# Patient Record
Sex: Female | Born: 1940 | Race: White | Hispanic: No | Smoking: Never smoker
Health system: Southern US, Academic
[De-identification: ages and names within clinical notes are randomized; demographics above are authoritative.]

## PROBLEM LIST (undated history)

## (undated) DIAGNOSIS — M1990 Unspecified osteoarthritis, unspecified site: Secondary | ICD-10-CM

## (undated) DIAGNOSIS — C9140 Hairy cell leukemia not having achieved remission: Secondary | ICD-10-CM

## (undated) DIAGNOSIS — I10 Essential (primary) hypertension: Secondary | ICD-10-CM

## (undated) DIAGNOSIS — Z85828 Personal history of other malignant neoplasm of skin: Secondary | ICD-10-CM

## (undated) DIAGNOSIS — E039 Hypothyroidism, unspecified: Secondary | ICD-10-CM

## (undated) HISTORY — PX: ESOPHAGUS SURGERY: SHX626

## (undated) HISTORY — DX: Unspecified osteoarthritis, unspecified site: M19.90

## (undated) HISTORY — DX: Hairy cell leukemia not having achieved remission: C91.40

## (undated) HISTORY — PX: GALLBLADDER SURGERY: SHX652

## (undated) HISTORY — DX: Hypothyroidism, unspecified: E03.9

## (undated) HISTORY — PX: TONSILLECTOMY: SUR1361

## (undated) HISTORY — PX: KNEE SURGERY: SHX244

## (undated) HISTORY — PX: HX HERNIA REPAIR: SHX51

## (undated) HISTORY — DX: Personal history of other malignant neoplasm of skin: Z85.828

## (undated) HISTORY — DX: Essential (primary) hypertension: I10

---

## 1987-01-02 ENCOUNTER — Other Ambulatory Visit (HOSPITAL_COMMUNITY): Payer: Self-pay

## 2016-01-01 IMAGING — MG 2D SCREENING DIGITAL MAMMOGRAM WITH CAD
1 series · 5 of 5 positions shown · non-contrast
Comparison: Exam dated 01/05/14 and 06/10/15.

------------- REPORT [id number] -------------
MOLIMOTO, KUAN-YU

2D SCREENING DIGITAL MAMMOGRAM WITH CAD
JOSE ANAIN KUN,NP
Exam:  
2D digital screening mammogram with CAD
HISTORY: Asymptomatic 74-year-old with strong family history of breast cancer in mother and sister. Lifetime breast cancer risk 20%.

[Series 2863: R CC · right · 0.10mm/px · 5 of 5 slices shown]
[im 1/5]
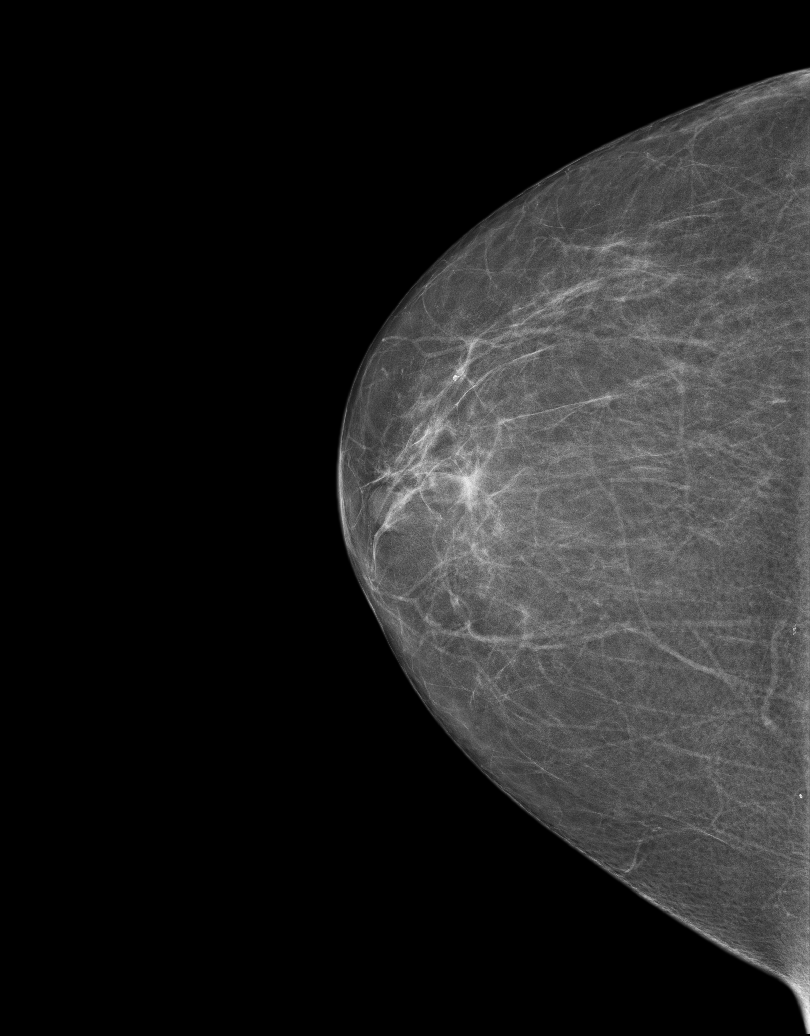
[im 2/5]
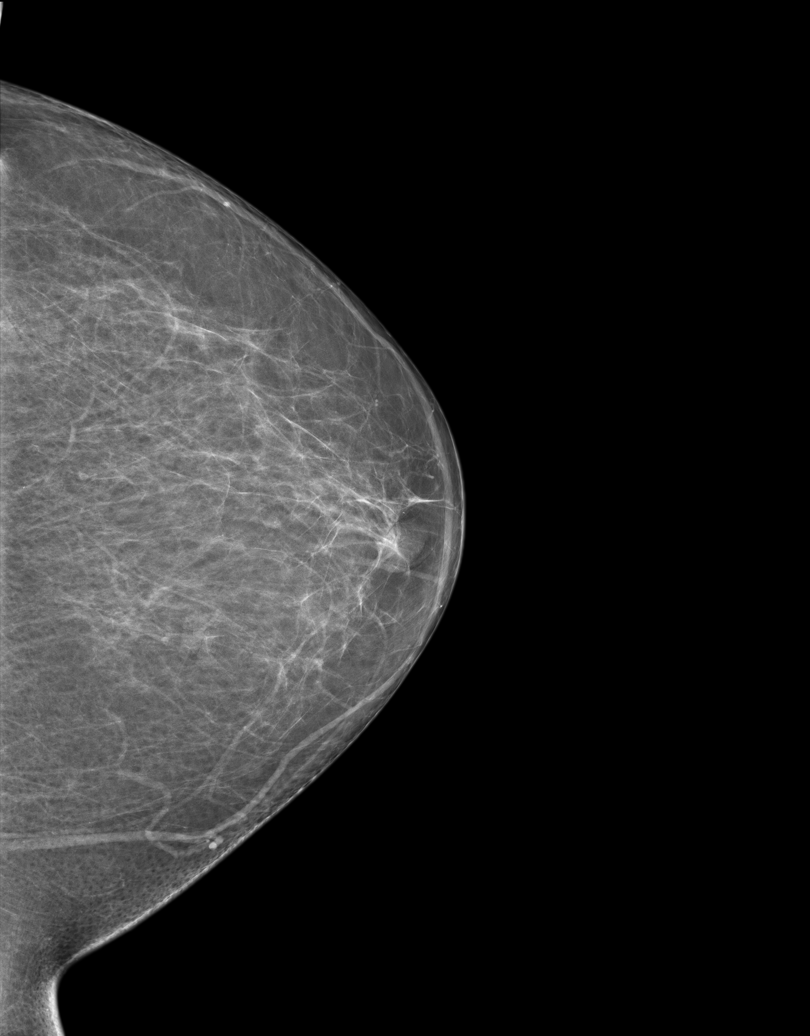
[im 3/5]
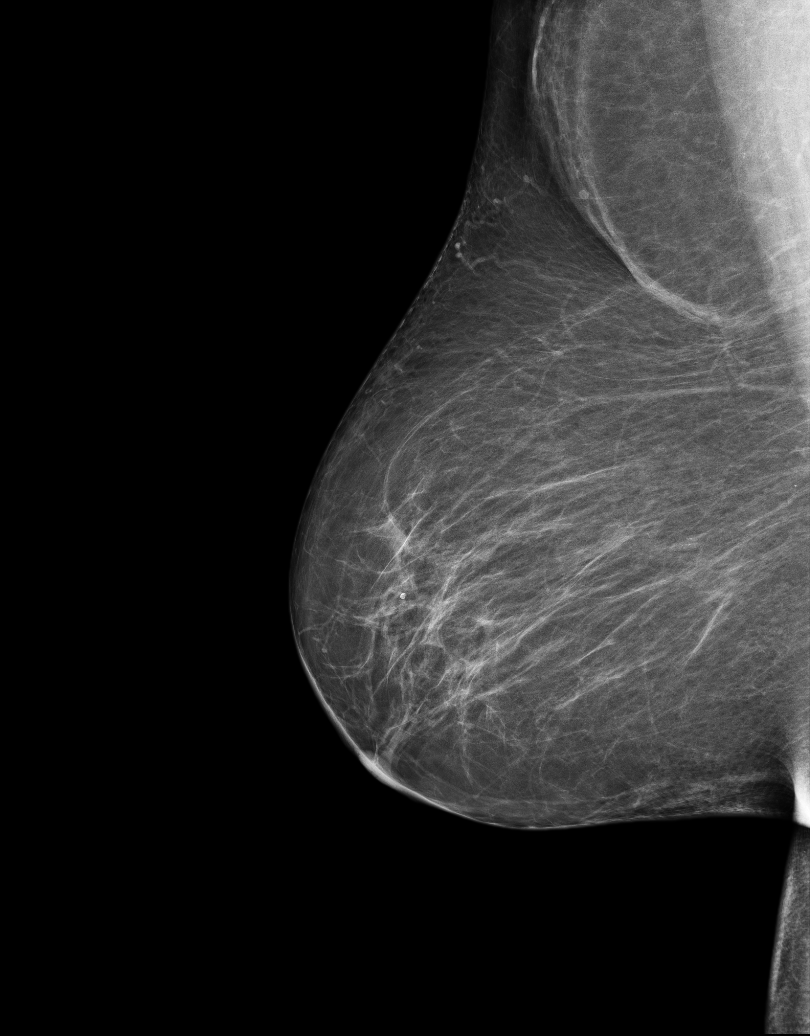
[im 4/5]
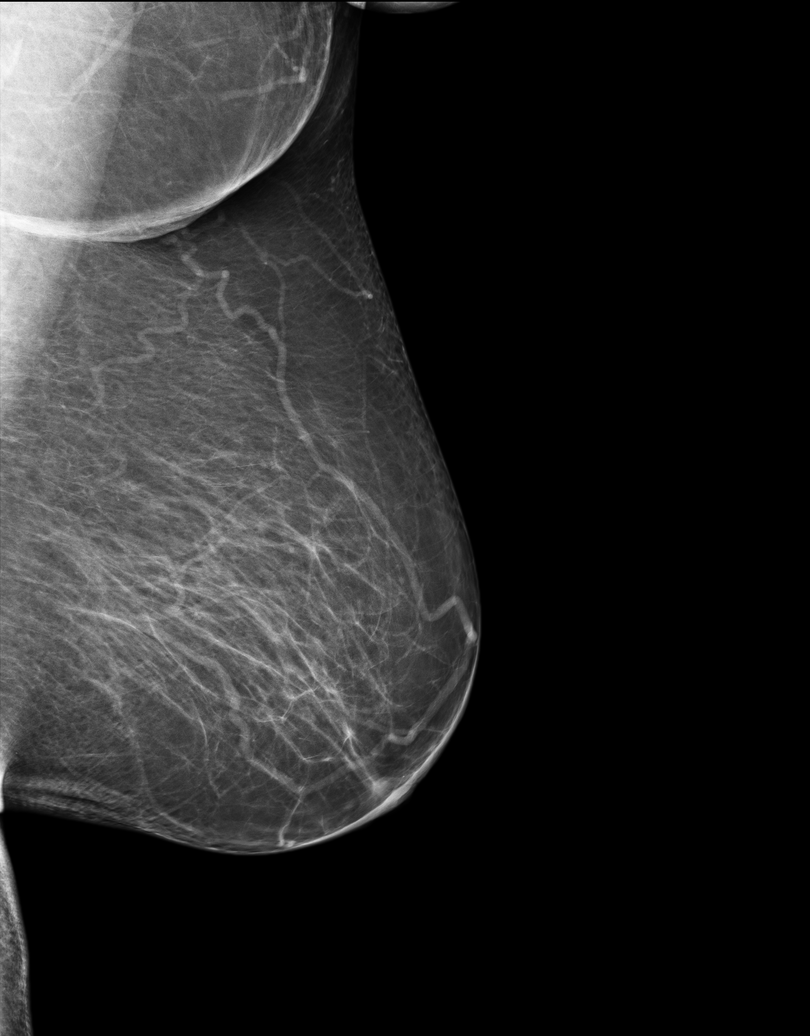
[im 5/5]
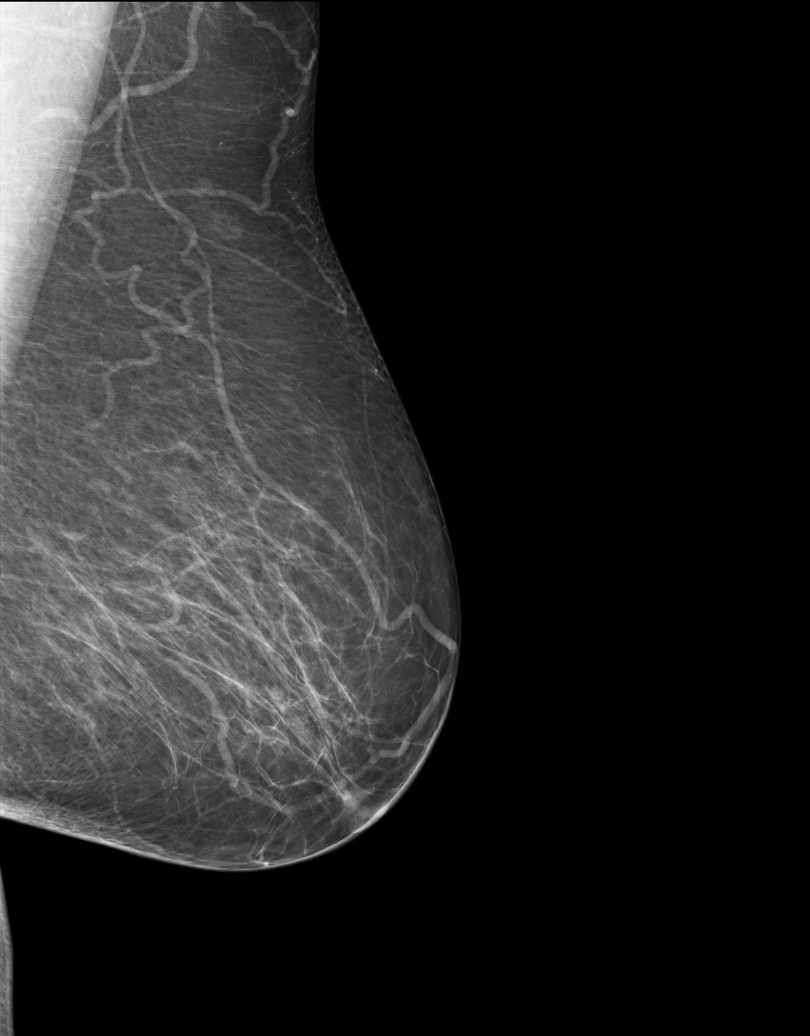

[5 of 5 positions shown; findings below may reference images not displayed]

FINDINGS: No focal mass or architectural change is noted. No abnormal calcific densities, skin changes, nipple changes, or duct dilation are seen. Benign calcific foci in the right breast are stable in appearance.
IMPRESSION: Stable mammographic findings. 

Clinical and mammographic followup at 12 months. 

Final Assessment Code:

Bi-Rads 2, density category B

BI-RADS 0
Need additional imaging evaluation

BI-RADS 1
Negative mammogram

BI-RADS 2
Benign finding

BI-RADS 3
Probably benign finding: short-interval follow-up suggested

BI-RADS 4
Suspicious abnormality:  biopsy should be considered

BI-RADS 5
Highly suggestive of malignancy; appropriate action should be taken

BI-RADS 6
Known Biopsy-proven Malignancy – Appropriate action should be taken

NOTE:
In compliance with Federal regulations, the results of this mammogram are being sent to the patient.

------------- REPORT [id number] -------------
[facility]
[address]
[place]ain[place] Ms.[place], [place]:
We wish to report the following on your recent mammography examination. We are sending a report to your referring physician or other health care provider. 
(       Normal/Negative:
No evidence of cancer.
This statement is mandated by the [place], Department of Health.
Your examination was performed by one of our technologists, who are registered radiological technologists and also specially certified in mammography:
___
Parlak, Edaly (M)
___
Dang, Mcalex (M)

Your mammogram was interpreted by our radiologist.

( 
Sofeine Made, M.D.

(Annual Breast Examination by a physician or other health care provider
(Annual Mammography Screening beginning at age 40
(Monthly Breast Self Examination

## 2018-12-11 IMAGING — MR MRI BRAIN WITHOUT AND WITH CONTRAST
8 of 11 series · 28 of 48 positions shown · IV contrast (gadavist)
Comparison: None.

EXAM:  MRI BRAIN WITHOUT AND WITH CONTRAST
INDICATION: Dizziness.
TECHNIQUE: Multiplanar, multisequential MRI of the brain and internal auditory canals was performed without and with 10 mL of Gadavist.

[Series 11: DWI · axial · 5.0mm · 1.35mm/px · z∈[-93,+63]mm · 3 of 27 slices shown (1 of 2)]
[im 1/27]
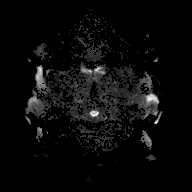
[im 14/27]
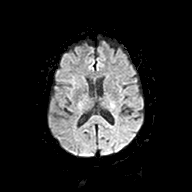
[im 27/27]
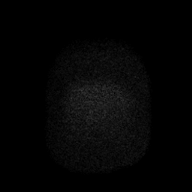

[Series 12: DWI · axial · 5.0mm · 1.35mm/px · z∈[-93,+63]mm · 3 of 27 slices shown (2 of 2)]
[im 1/27]
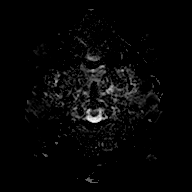
[im 14/27]
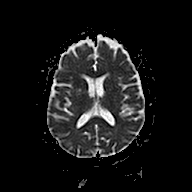
[im 27/27]
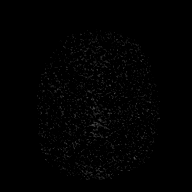

[Series 13: FLAIR · sagittal · 4.0mm · 0.75mm/px · 3 of 26 slices shown (1 of 2)]
[im 1/26]
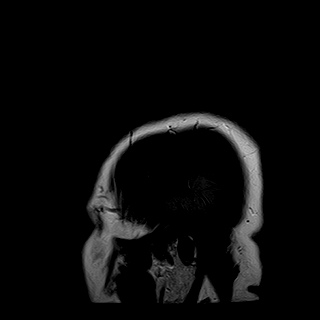
[im 13/26]
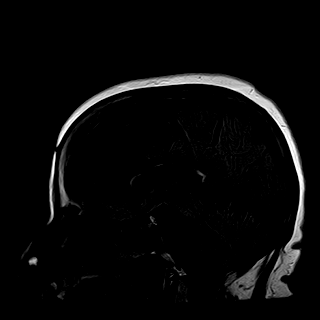
[im 26/26]
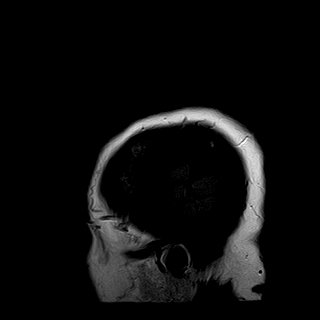

[Series 14: T2 · axial · 4.0mm · 0.43mm/px · z∈[-79,+61]mm · 6 of 36 slices shown]
[im 1/36]
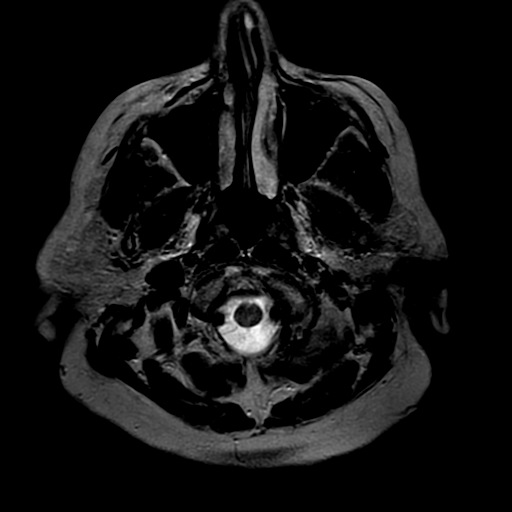
[im 8/36]
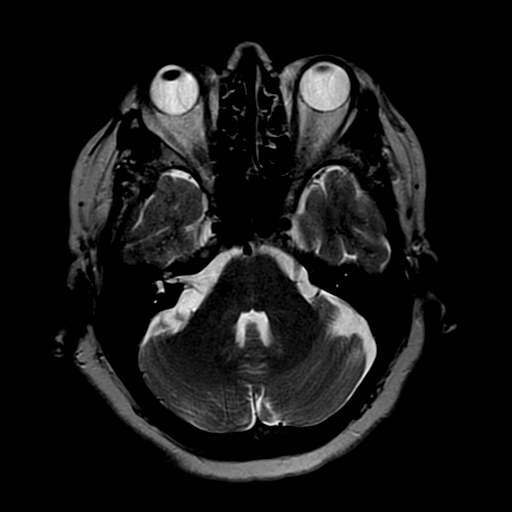
[im 15/36]
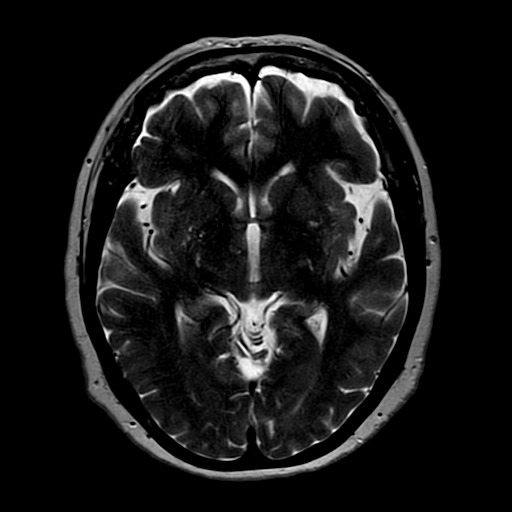
[im 22/36]
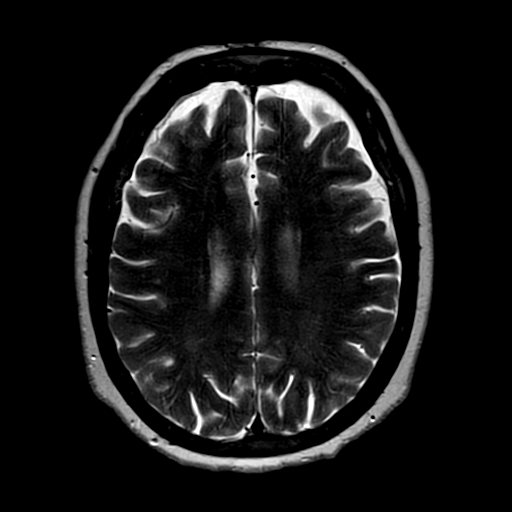
[im 29/36]
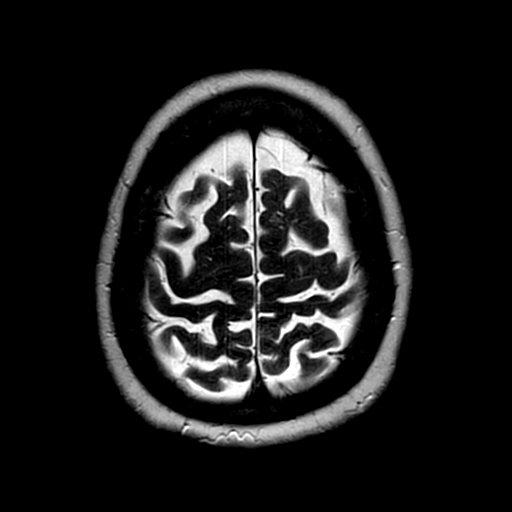
[im 36/36]
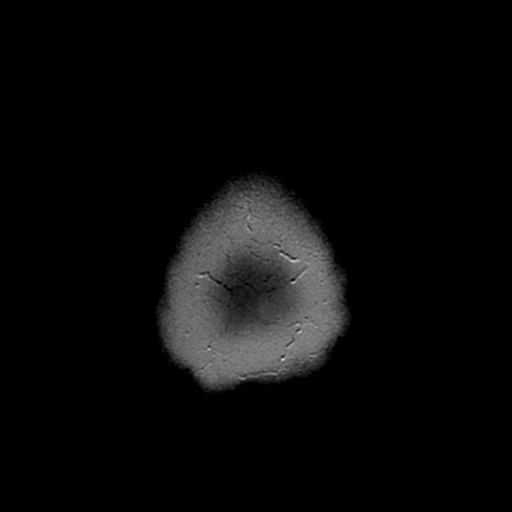

[Series 15: FLAIR · axial · 4.0mm · 0.43mm/px · z∈[-79,+61]mm · 6 of 36 slices shown (2 of 2)]
[im 1/36]
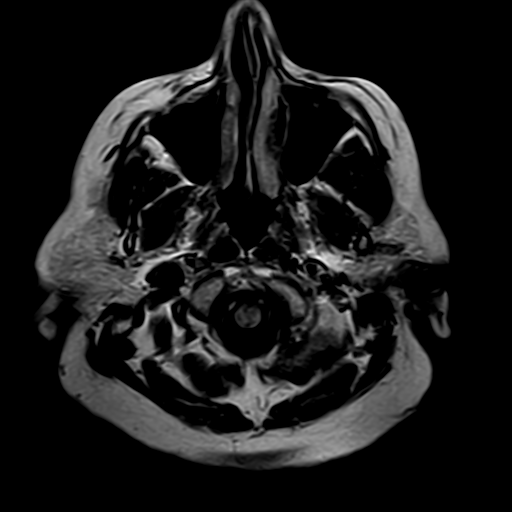
[im 8/36]
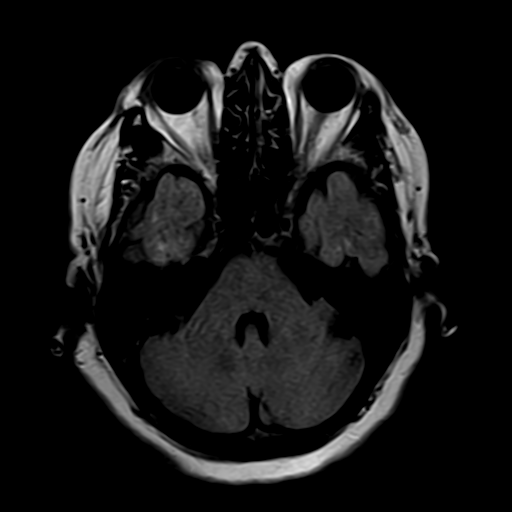
[im 15/36]
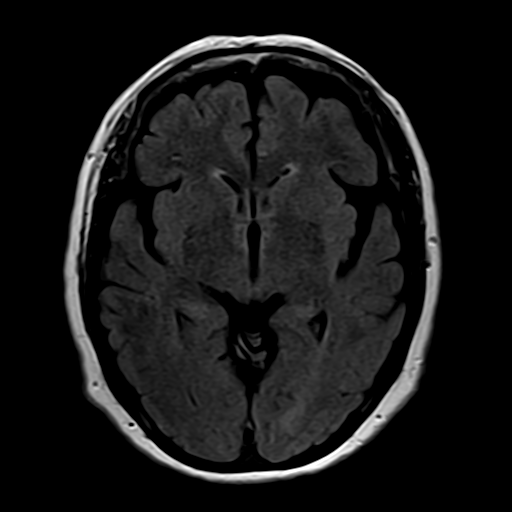
[im 22/36]
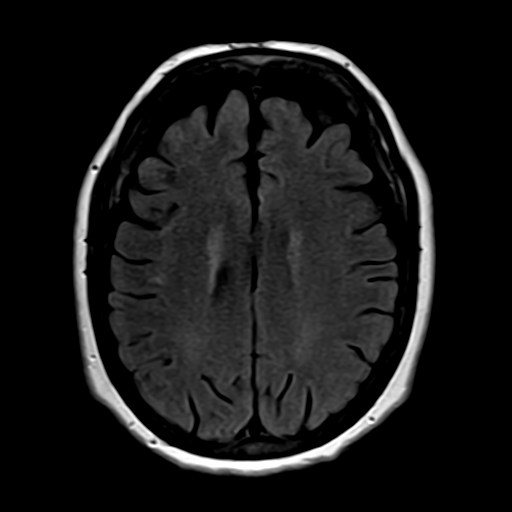
[im 29/36]
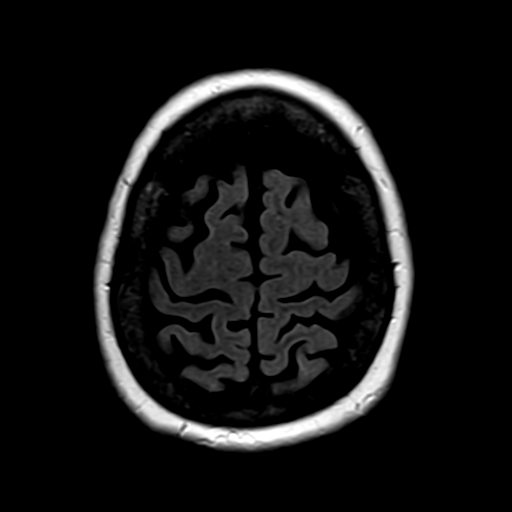
[im 36/36]
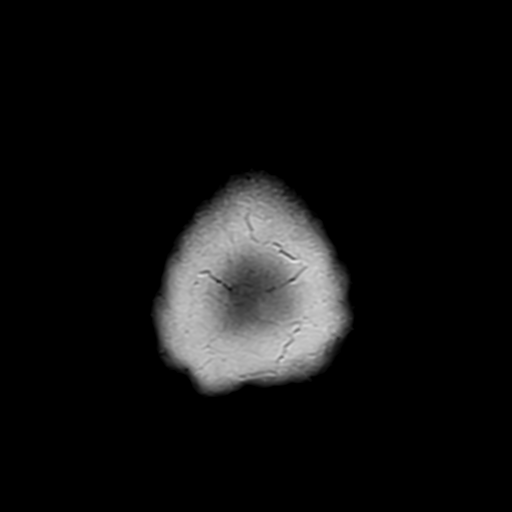

[Series 16: T1 · axial · 4.0mm · 0.43mm/px · z∈[-79,-23]mm · 3 of 36 slices shown]
[im 1/36]
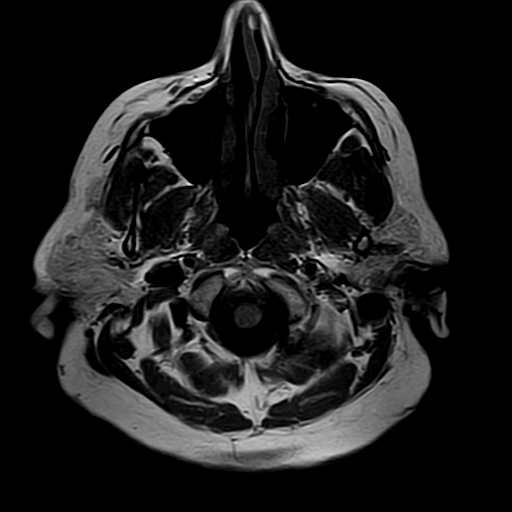
[im 8/36]
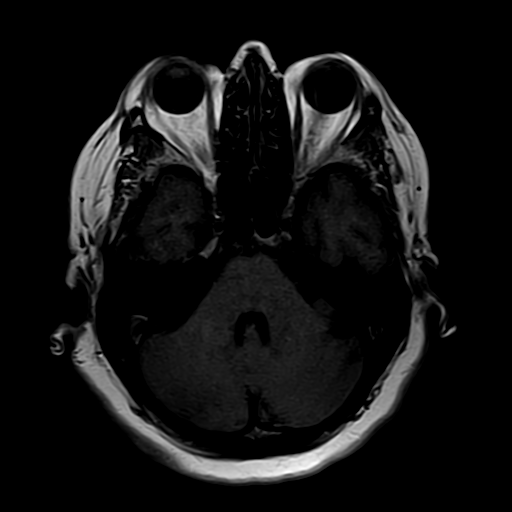
[im 15/36]
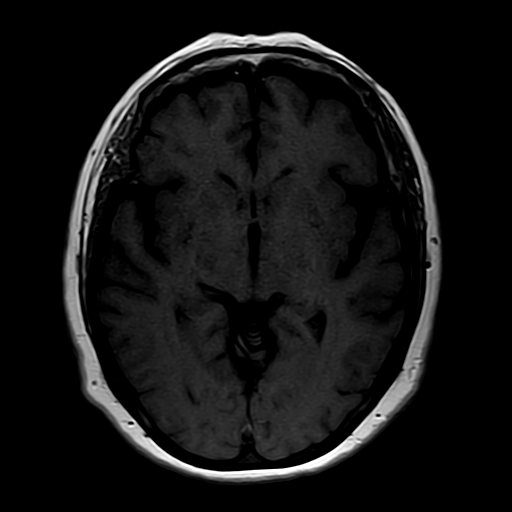

[Series 20: T1 post-contrast · axial · 3.0mm · 0.47mm/px · z∈[-67,-34]mm · 2 of 11 slices shown (1 of 2)]
[im 1/11]
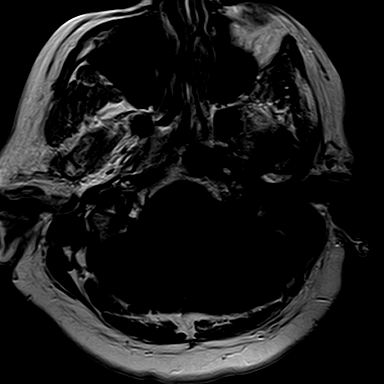
[im 11/11]
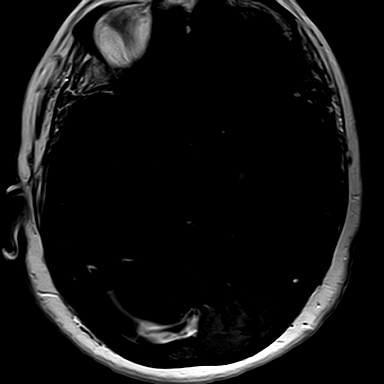

[Series 21: T1 post-contrast · coronal · 3.0mm · 0.47mm/px · 2 of 11 slices shown (2 of 2)]
[im 1/11]
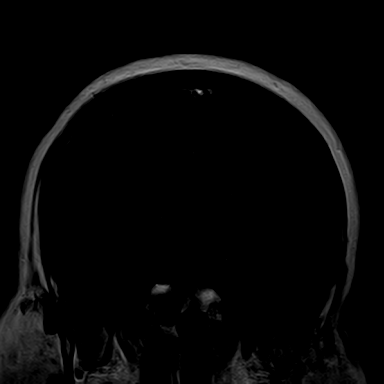
[im 11/11]
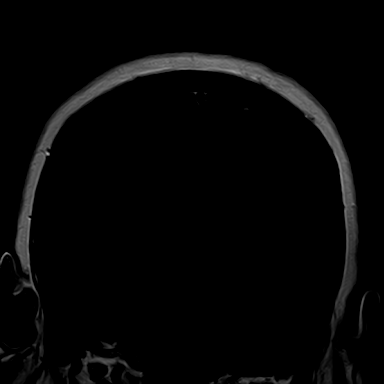

[28 of 48 positions shown; findings below may reference images not displayed]

FINDINGS: Ventricular and sulcal size is normal for the patient’s age.  There are minimal chronic small vessel ischemic changes.  There is no mass effect, midline shift or intracranial hemorrhage.  There is no evidence of acute infarction.  Skull base flow voids and basal cisterns are patent.  Sagittal survey of midline structures is unremarkable.  There is no cerebellopontine angle mass.  Normal T2 signal intensity is noted within the cochlea, vestibule and semicircular canals bilaterally. 

Following intravenous contrast administration, there is no abnormal parenchymal or leptomeningeal enhancement.  No abnormal enhancement of the internal auditory canals is seen.  There are no extraaxial fluid collections.  Visualized paranasal sinuses, mastoid air cells and orbital contents are also unremarkable.
IMPRESSION: Minimal chronic small vessel ischemic changes, no acute intracranial abnormality. 

Unremarkable internal auditory canals.

## 2019-01-12 IMAGING — MR MRI LUMBAR SPINE WITHOUT CONTRAST
9 series · 48 of 48 positions shown · IV contrast (gadolinium)
Comparison: None available.

EXAM:  MRI LUMBAR SPINE WITHOUT CONTRAST
INDICATION: Lower back pain and gait disturbance. History of back surgery 5 years ago.
TECHNIQUE: Multiplanar multisequential MRI of the lumbar spine was performed without gadolinium contrast.

[Series 4: sca (id) · sagittal · 10.0mm · 1.76mm/px · 1 of 5 slices shown (1 of 3)]
[im 1/5]
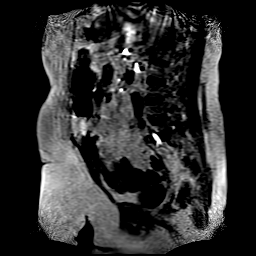

[Series 5: sca (id) · coronal · 10.0mm · 1.76mm/px · 2 of 5 slices shown (2 of 3)]
[im 1/5]
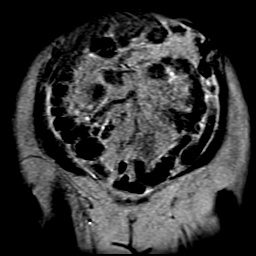
[im 5/5]
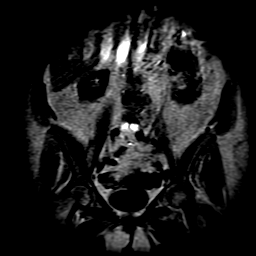

[Series 6: sca (id) · axial · 10.0mm · 1.76mm/px · z∈[-30,+30]mm · 2 of 5 slices shown (3 of 3)]
[im 1/5]
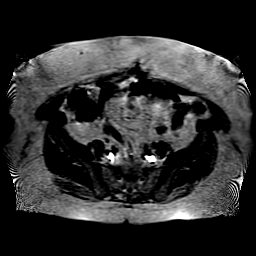
[im 5/5]
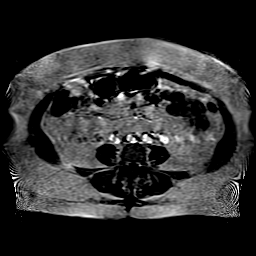

[Series 7: T2 · sagittal · 5.0mm · 1.08mm/px · 5 of 13 slices shown (1 of 3)]
[im 1/13]
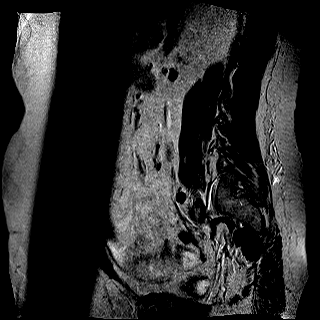
[im 4/13]
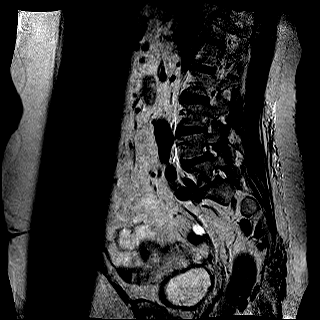
[im 7/13]
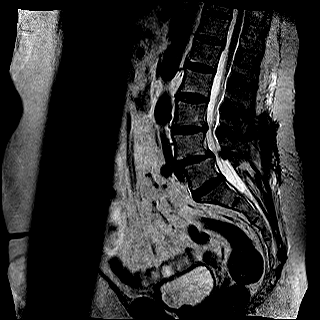
[im 10/13]
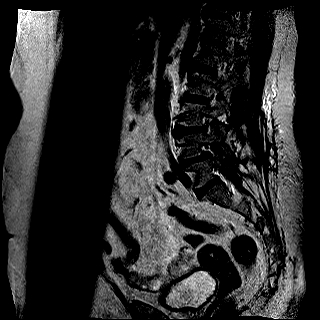
[im 13/13]
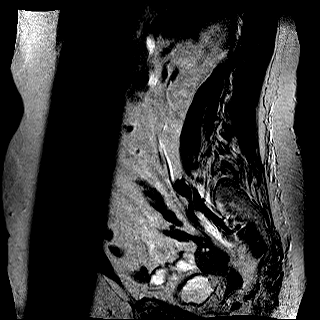

[Series 8: T1 · sagittal · 5.0mm · 1.08mm/px · 5 of 13 slices shown (1 of 2)]
[im 1/13]
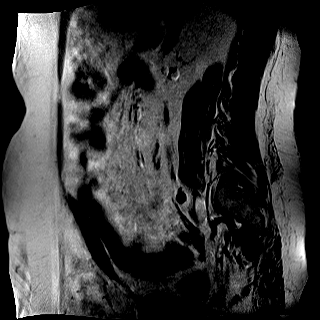
[im 4/13]
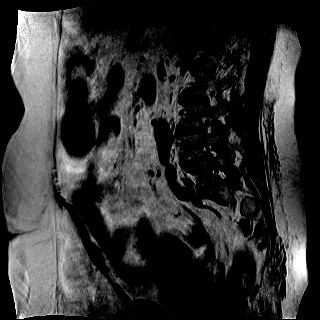
[im 7/13]
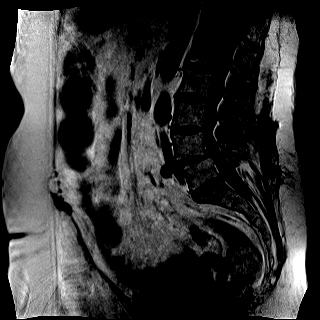
[im 10/13]
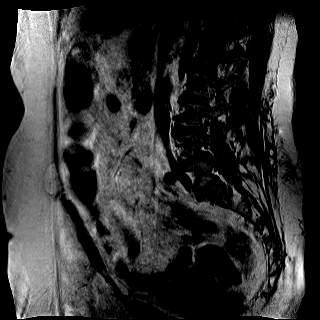
[im 13/13]
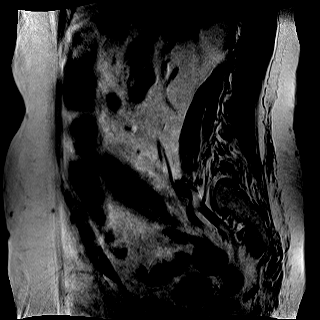

[Series 10: T2 · axial · 5.0mm · 0.89mm/px · z∈[-53,+146]mm · 10 of 25 slices shown (2 of 3)]
[im 1/25]
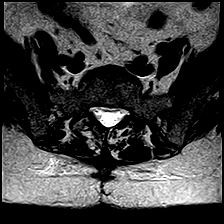
[im 3/25]
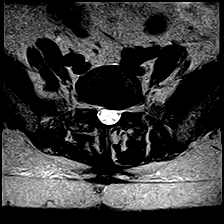
[im 6/25]
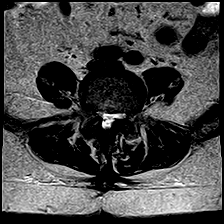
[im 9/25]
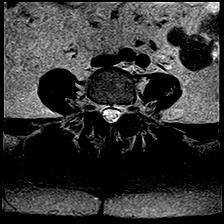
[im 11/25]
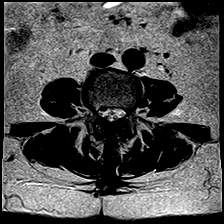
[im 14/25]
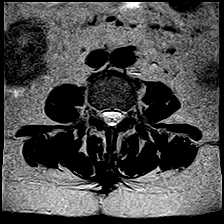
[im 17/25]
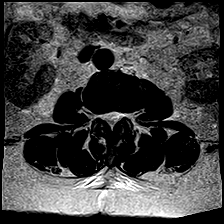
[im 19/25]
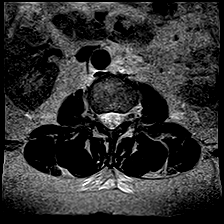
[im 22/25]
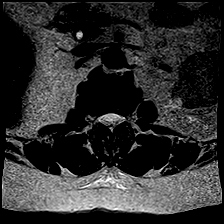
[im 25/25]
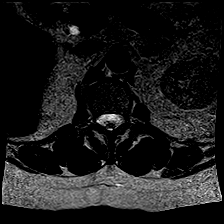

[Series 11: T1 · axial · 5.0mm · 0.89mm/px · z∈[-53,+146]mm · 10 of 25 slices shown (2 of 2)]
[im 1/25]
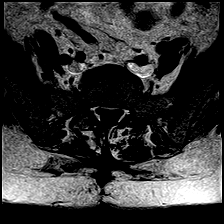
[im 3/25]
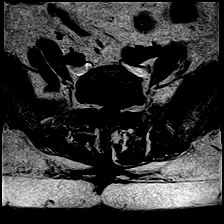
[im 6/25]
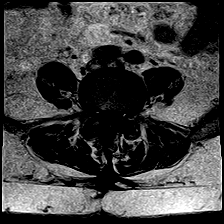
[im 9/25]
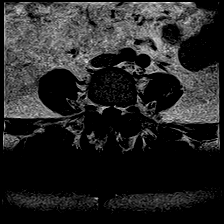
[im 11/25]
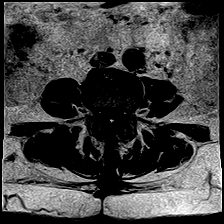
[im 14/25]
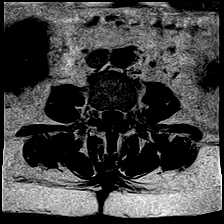
[im 17/25]
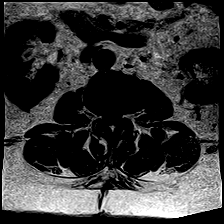
[im 19/25]
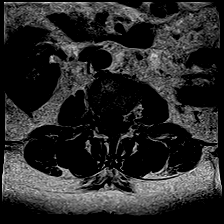
[im 22/25]
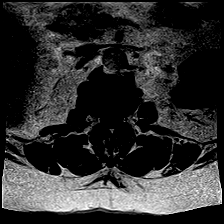
[im 25/25]
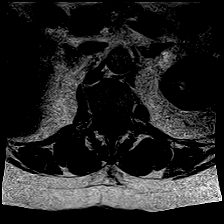

[Series 12: STIR · sagittal · 5.0mm · 1.35mm/px · 5 of 13 slices shown]
[im 1/13]
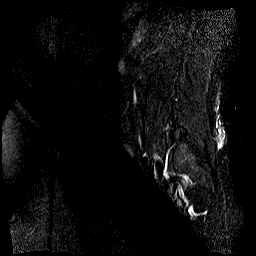
[im 4/13]
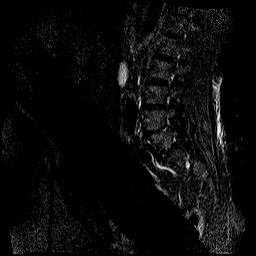
[im 7/13]
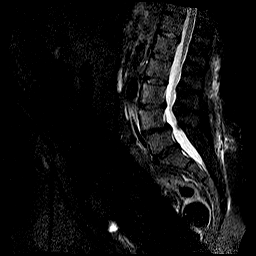
[im 10/13]
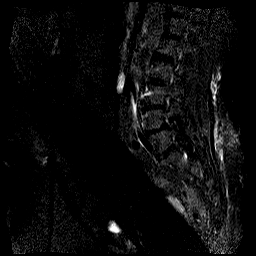
[im 13/13]
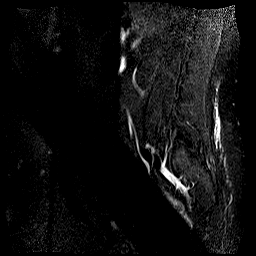

[Series 13: T2 · coronal · 4.0mm · 1.56mm/px · 8 of 20 slices shown (3 of 3)]
[im 1/20]
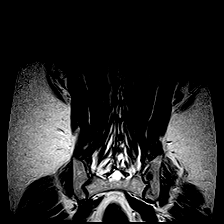
[im 3/20]
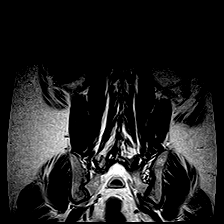
[im 6/20]
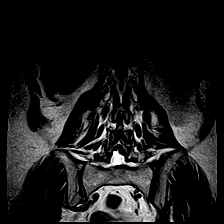
[im 9/20]
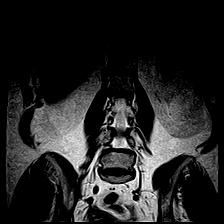
[im 11/20]
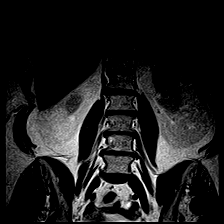
[im 14/20]
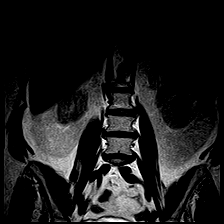
[im 17/20]
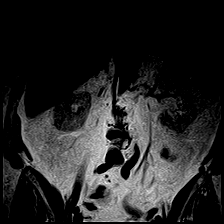
[im 20/20]
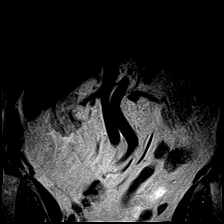

[48 of 48 positions shown; findings below may reference images not displayed]

FINDINGS: Vertebral bodies are normal in height and signal intensity. There is no acute fracture or subluxation. Distal spinal cord is normal in signal intensity and terminates normally at L1 vertebral body level. Spinal canal is congenitally narrow.

At T12-L1 level, there is a small broad-based central disc bulge mildly effacing the ventral thecal sac. There is no significant neural foraminal stenosis.

L1-2 level is unremarkable.

At L2-3 level, there is a minimal bulging annulus, minimally effacing the ventral thecal sac. There is no significant neural foraminal stenosis.

At L3-4 level, there is a small broad-based central disc bulge resulting in severe spinal stenosis. There is mild-to-moderate bilateral neural foraminal stenosis from facet arthropathy and bulging annulus.

At L4-5 level, there is grade 1 anterolisthesis of L4 on L5 vertebral body. There is also suggestion of a right hemilaminectomy defect at this level. There is a small broad-based central disc bulge resulting in moderate spinal stenosis. There is mild-to-moderate bilateral neural foraminal stenosis from facet arthropathy and bulging annulus.

At L5-S1 level, there is a small broad-based central and left paracentral disc bulge mildly effacing the ventral thecal sac. There is mild bilateral neural foraminal stenosis from facet arthropathy and bulging annulus without nerve root impingement.

Paraspinal soft tissues are unremarkable.
IMPRESSION: Suggestion of a right hemilaminectomy defect at L4-5 level. 

Grade 1 anterolisthesis of L4 on L5 vertebral body. 

Severe spinal stenosis at L3-4 level and moderate spinal stenosis at L4-5 level from small disc bulges. 

Multilevel neural foraminal stenosis as detailed above.

## 2019-01-12 IMAGING — MR MRI CERVICAL SPINE WITHOUT CONTRAST
4 of 5 series · 23 of 48 positions shown · IV contrast (gadolinium)
Comparison: None available.

EXAM:  MRI CERVICAL SPINE WITHOUT CONTRAST
INDICATION: Myelopathy.
TECHNIQUE: Multiplanar multisequential MRI of the cervical spine was performed without gadolinium contrast.

[Series 11: T2 · sagittal · 3.0mm · 0.75mm/px · 7 of 13 slices shown (1 of 2)]
[im 1/13]
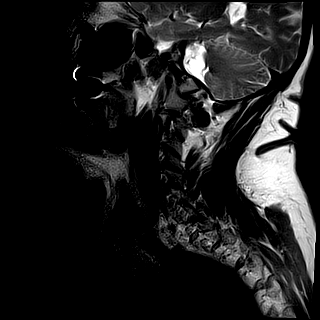
[im 3/13]
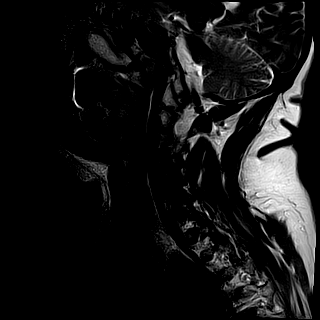
[im 5/13]
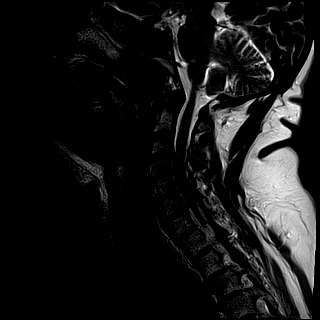
[im 7/13]
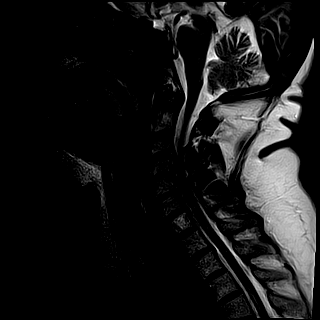
[im 9/13]
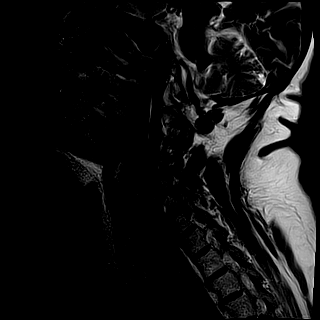
[im 11/13]
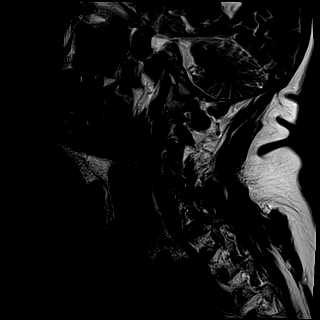
[im 13/13]
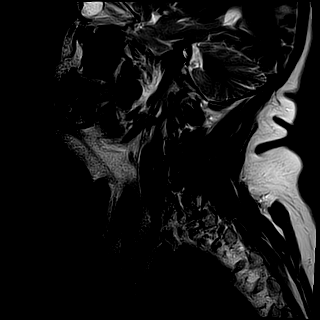

[Series 12: T1 · sagittal · 3.0mm · 0.47mm/px · 4 of 13 slices shown]
[im 1/13]
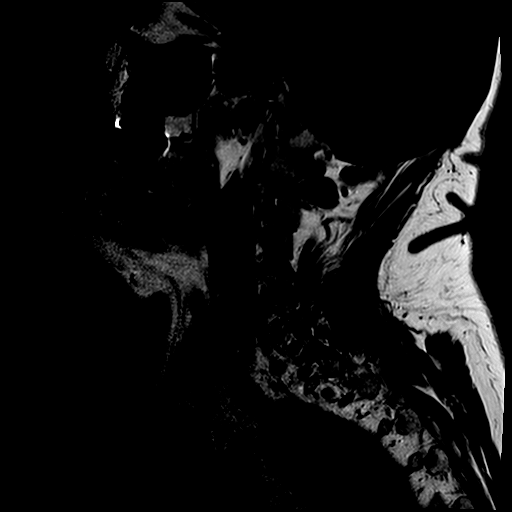
[im 2/13]
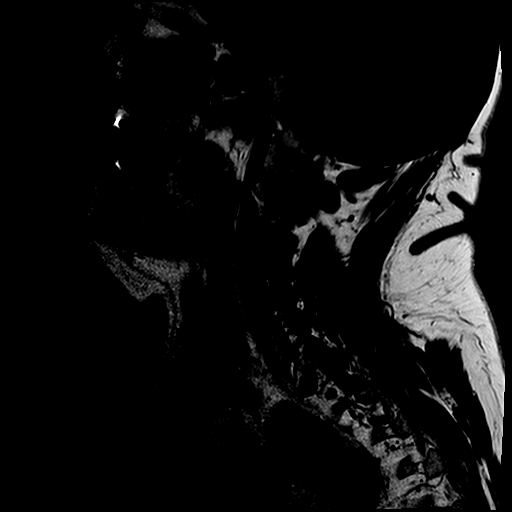
[im 7/13]
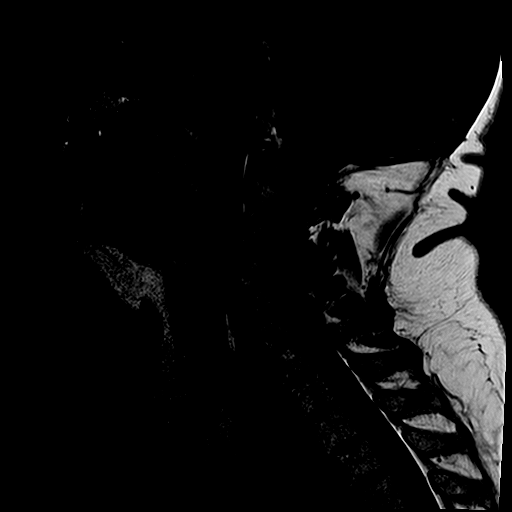
[im 11/13]
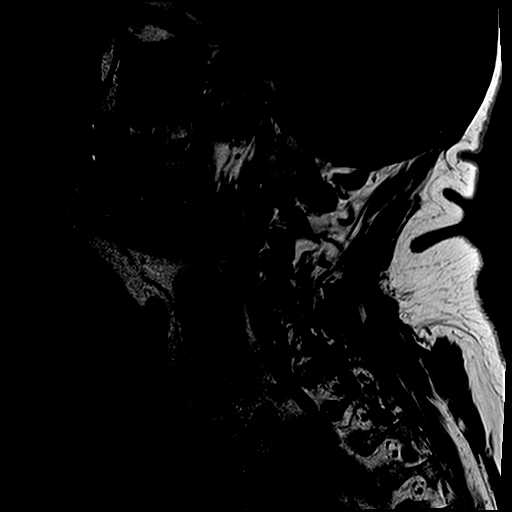

[Series 13: T2 · axial · 3.0mm · 0.35mm/px · z∈[-83,+17]mm · 9 of 26 slices shown (2 of 2)]
[im 1/26]
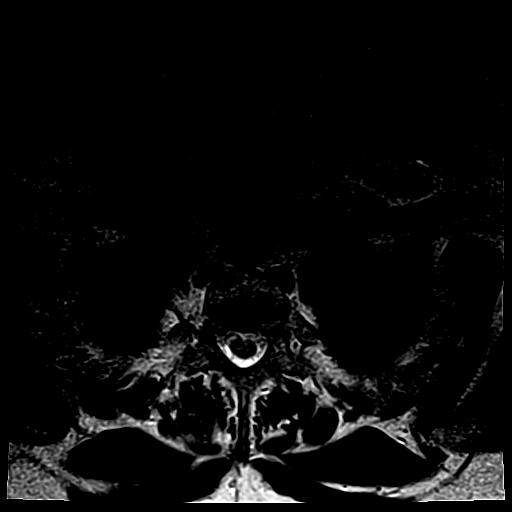
[im 4/26]
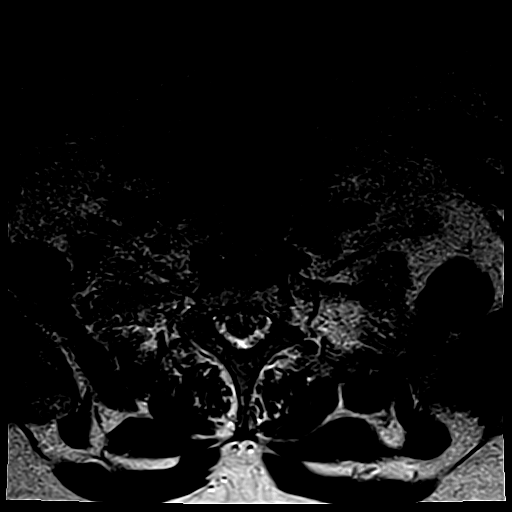
[im 8/26]
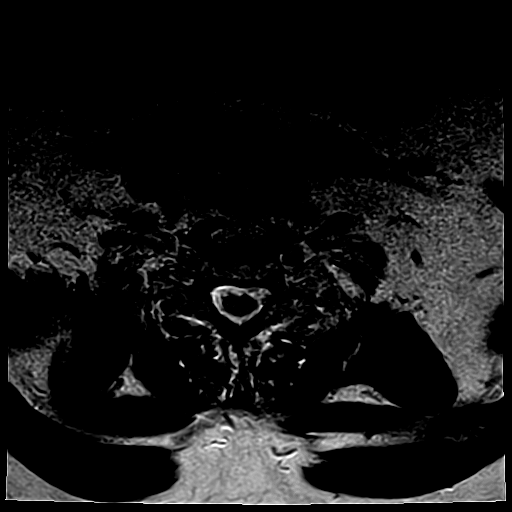
[im 11/26]
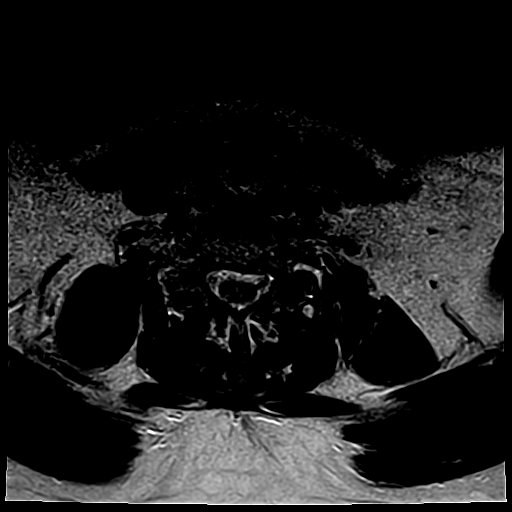
[im 13/26]
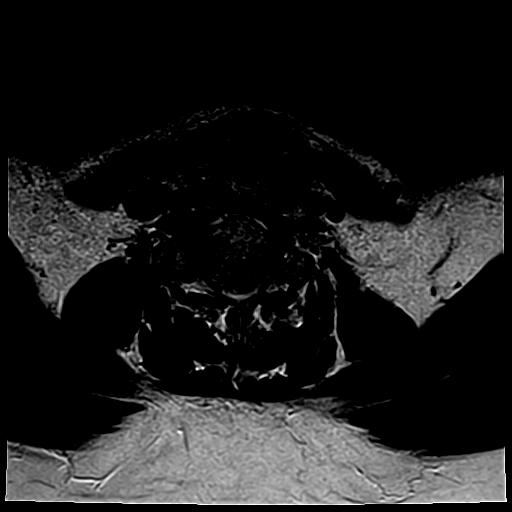
[im 15/26]
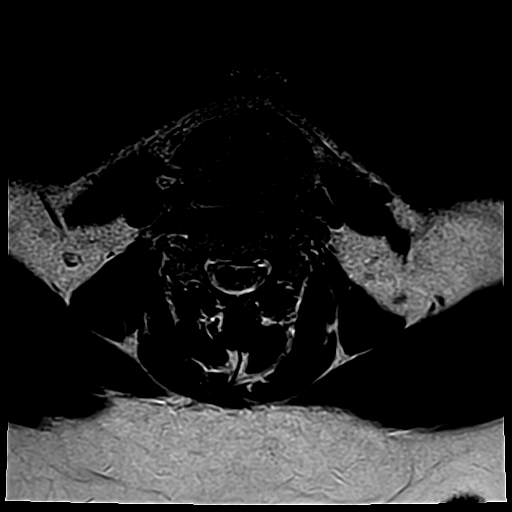
[im 18/26]
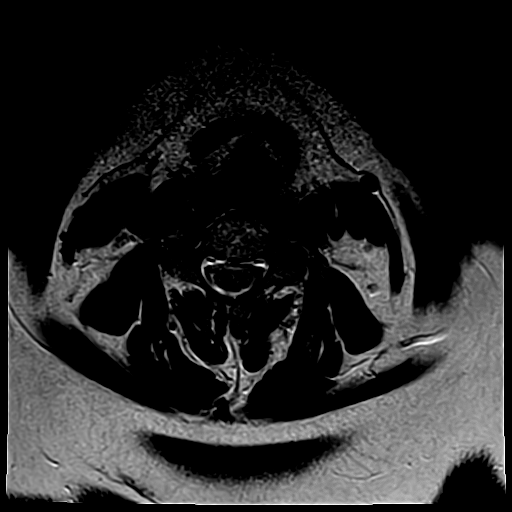
[im 22/26]
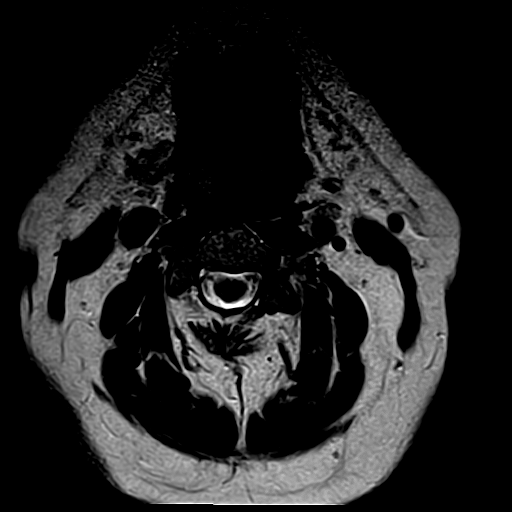
[im 26/26]
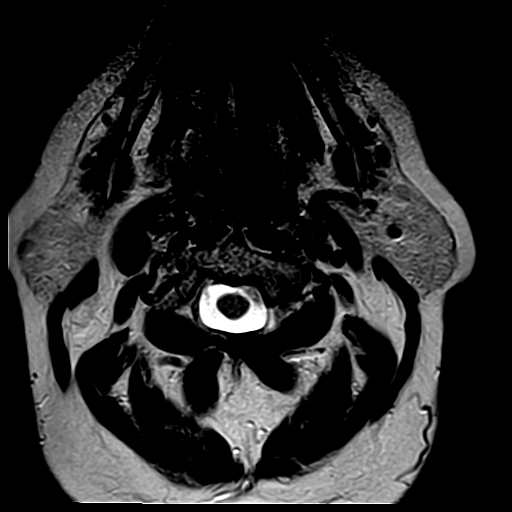

[Series 15: STIR · sagittal · 3.0mm · 0.47mm/px · 3 of 13 slices shown]
[im 2/13]
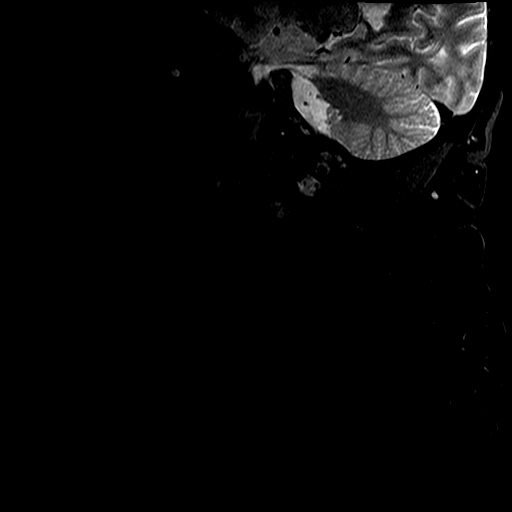
[im 7/13]
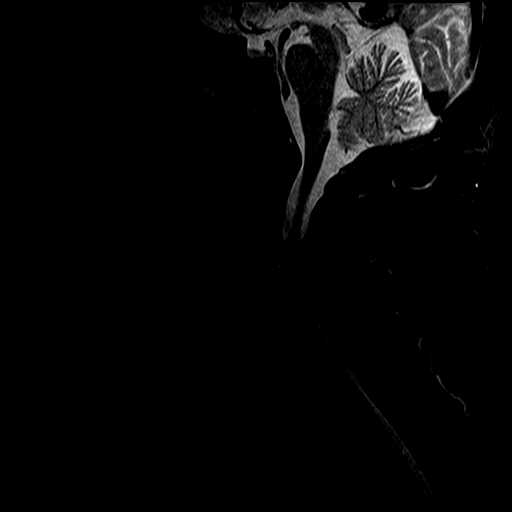
[im 11/13]
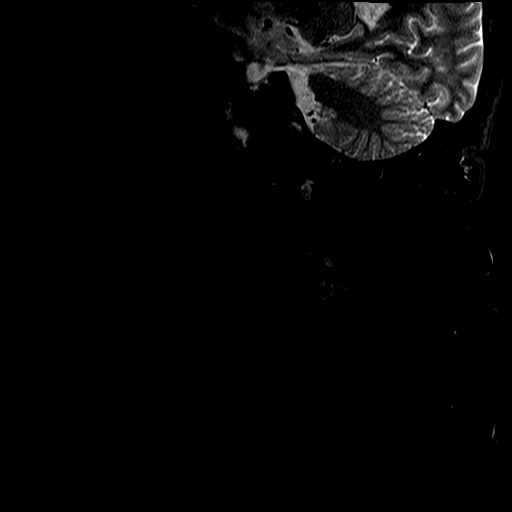

[23 of 48 positions shown; findings below may reference images not displayed]

FINDINGS: Vertebral bodies are normal in height, alignment and signal intensity. There is no acute fracture or subluxation. Visualized spinal cord is normal in signal intensity.

C2-3 level is unremarkable.

At C3-4 level, there is a small broad-based central disc osteophyte complex with near complete effacement of the ventral CSF. There is moderate right neural foraminal stenosis from uncovertebral joint hypertrophy.

At C4-5 level, there is a small broad-based central disc osteophyte complex with near complete effacement of the ventral CSF. There is severe right and moderate left neural foraminal stenosis from facet and uncovertebral joint hypertrophy.

At C5-6 level, there is a small broad-based central disc osteophyte complex resulting in mild-to-moderate spinal stenosis. There is severe bilateral neural foraminal stenosis from uncovertebral joint hypertrophy.

At C6-7 level, there is a small broad-based central disc osteophyte complex partially effacing the ventral CSF. There is severe bilateral neural foraminal stenosis from uncovertebral joint hypertrophy.

C7-T1 level and paraspinal soft tissues are unremarkable.
IMPRESSION: 1. Small disc osteophyte complexes at most levels with mild-to-moderate spinal stenosis at C5-6 level. 2. Multilevel neural foraminal stenosis as detailed above.

## 2021-03-27 ENCOUNTER — Encounter (INDEPENDENT_AMBULATORY_CARE_PROVIDER_SITE_OTHER): Payer: Self-pay

## 2021-04-01 ENCOUNTER — Encounter (INDEPENDENT_AMBULATORY_CARE_PROVIDER_SITE_OTHER): Payer: Self-pay | Admitting: Hematology & Oncology

## 2021-05-07 ENCOUNTER — Encounter (INDEPENDENT_AMBULATORY_CARE_PROVIDER_SITE_OTHER): Payer: Self-pay | Admitting: Family

## 2022-08-16 ENCOUNTER — Ambulatory Visit (INDEPENDENT_AMBULATORY_CARE_PROVIDER_SITE_OTHER): Payer: Self-pay | Admitting: NURSE PRACTITIONER

## 2023-02-04 ENCOUNTER — Emergency Department
Admission: EM | Admit: 2023-02-04 | Discharge: 2023-02-04 | Disposition: A | Discharge status: Home / Self Care | Payer: Medicare PPO | Attending: Emergency Medicine | Admitting: Emergency Medicine

## 2023-02-04 ENCOUNTER — Other Ambulatory Visit: Payer: Self-pay

## 2023-02-04 DIAGNOSIS — E669 Obesity, unspecified: Secondary | ICD-10-CM | POA: Insufficient documentation

## 2023-02-04 DIAGNOSIS — Z8571 Personal history of Hodgkin lymphoma: Secondary | ICD-10-CM | POA: Insufficient documentation

## 2023-02-04 DIAGNOSIS — I10 Essential (primary) hypertension: Secondary | ICD-10-CM | POA: Insufficient documentation

## 2023-02-04 DIAGNOSIS — R42 Dizziness and giddiness: Secondary | ICD-10-CM | POA: Insufficient documentation

## 2023-02-04 DIAGNOSIS — R7989 Other specified abnormal findings of blood chemistry: Secondary | ICD-10-CM

## 2023-02-04 DIAGNOSIS — Z6841 Body Mass Index (BMI) 40.0 and over, adult: Secondary | ICD-10-CM | POA: Insufficient documentation

## 2023-02-04 DIAGNOSIS — Z86718 Personal history of other venous thrombosis and embolism: Secondary | ICD-10-CM

## 2023-02-04 DIAGNOSIS — R072 Precordial pain: Secondary | ICD-10-CM | POA: Insufficient documentation

## 2023-02-04 LAB — COMPREHENSIVE METABOLIC PANEL, NON-FASTING
ALBUMIN/GLOBULIN RATIO: 1.3 (ref 0.8–1.4)
ALBUMIN: 4 g/dL (ref 3.5–5.7)
ALKALINE PHOSPHATASE: 95 U/L (ref 34–104)
ALT (SGPT): 17 U/L (ref 7–52)
ANION GAP: 4 mmol/L (ref 4–13)
AST (SGOT): 25 U/L (ref 13–39)
BILIRUBIN TOTAL: 0.3 mg/dL (ref 0.3–1.2)
BUN/CREA RATIO: 28 — ABNORMAL HIGH (ref 6–22)
BUN: 28 mg/dL — ABNORMAL HIGH (ref 7–25)
CALCIUM, CORRECTED: 9.3 mg/dL (ref 8.9–10.8)
CALCIUM: 9.3 mg/dL (ref 8.6–10.3)
CHLORIDE: 107 mmol/L (ref 98–107)
CO2 TOTAL: 27 mmol/L (ref 21–31)
CREATININE: 1.01 mg/dL (ref 0.60–1.30)
ESTIMATED GFR: 57 mL/min/{1.73_m2} — ABNORMAL LOW (ref >59)
GLOBULIN: 3 (ref 2.9–5.4)
GLUCOSE: 101 mg/dL (ref 74–109)
OSMOLALITY, CALCULATED: 281 mOsm/kg (ref 270–290)
POTASSIUM: 4.4 mmol/L (ref 3.5–5.1)
PROTEIN TOTAL: 7 g/dL (ref 6.4–8.9)
SODIUM: 138 mmol/L (ref 136–145)

## 2023-02-04 LAB — CBC WITH DIFF
BASOPHIL #: 0.1 10*3/uL (ref 0.00–0.10)
BASOPHIL %: 1 % (ref 0–1)
EOSINOPHIL #: 0.3 10*3/uL (ref 0.00–0.50)
EOSINOPHIL %: 3 %
HCT: 42.4 % — ABNORMAL HIGH (ref 31.2–41.9)
HGB: 14 g/dL (ref 10.9–14.3)
LYMPHOCYTE #: 1.9 10*3/uL (ref 1.00–3.00)
LYMPHOCYTE %: 22 % (ref 16–44)
MCH: 29.3 pg (ref 24.7–32.8)
MCHC: 33.1 g/dL (ref 32.3–35.6)
MCV: 88.5 fL (ref 75.5–95.3)
MONOCYTE #: 0.6 10*3/uL (ref 0.30–1.00)
MONOCYTE %: 7 % (ref 5–13)
MPV: 8.9 fL (ref 7.9–10.8)
NEUTROPHIL #: 5.7 10*3/uL (ref 1.85–7.80)
NEUTROPHIL %: 67 % (ref 43–77)
PLATELETS: 221 10*3/uL (ref 140–440)
RBC: 4.79 10*6/uL (ref 3.63–4.92)
RDW: 13.7 % (ref 12.3–17.7)
WBC: 8.5 10*3/uL (ref 3.8–11.8)

## 2023-02-04 LAB — TROPONIN-I
TROPONIN I: 7 ng/L (ref <15)
TROPONIN I: 7 ng/L (ref <15)

## 2023-02-04 LAB — GOLD TOP TUBE

## 2023-02-04 LAB — MAGNESIUM: MAGNESIUM: 1.8 mg/dL — ABNORMAL LOW (ref 1.9–2.7)

## 2023-02-04 LAB — BLUE TOP TUBE

## 2023-02-04 MED ORDER — MAGNESIUM SULFATE 1 GRAM/100 ML IN DEXTROSE 5 % INTRAVENOUS PIGGYBACK
INJECTION | INTRAVENOUS | Status: AC
Start: 2023-02-04 — End: 2023-02-04
  Filled 2023-02-04: qty 200

## 2023-02-04 MED ORDER — MAGNESIUM SULFATE 1 GRAM/100 ML IN DEXTROSE 5 % INTRAVENOUS PIGGYBACK
1.0000 g | INJECTION | INTRAVENOUS | Status: AC
Start: 2023-02-04 — End: 2023-02-04
  Administered 2023-02-04 (×2): 1 g via INTRAVENOUS
  Administered 2023-02-04 (×2): 0 g via INTRAVENOUS

## 2023-02-04 MED ORDER — SODIUM CHLORIDE 0.9 % IV BOLUS
500.0000 mL | INJECTION | Status: AC
Start: 2023-02-04 — End: 2023-02-04
  Administered 2023-02-04: 500 mL via INTRAVENOUS
  Administered 2023-02-04: 0 mL via INTRAVENOUS

## 2023-02-04 NOTE — Discharge Instructions (Signed)
Make sure that you are drinking plenty of fluids throughout the day   See a cardiologist from the list provided below within the next 1 to 2 weeks.  You do need to have a formal workup for chest pain.  Return to the ER for any emergencies   Continue all your normal medications as prescribed by your doctor   Use progressive standing when standing up to prevent dizziness and falls.  Cardiologist List    Cindie Crumbly,  MD  [facility]  [address]  [place], [place] [address]  [phone]    Naeem A. Lana Fish, MD, [facility]  [address] [facility]  [place], [place] [address]  [phone]    Lonzo Cloud, MD, [facility]  [address] [place], [place] [address]  [phone]    Dorcas Mcmurray, MD  [address]  [place], [place] [address]  [phone]    Maryella Shivers, MD  [address]  [place], [place] [address]  [phone]    Elam Dutch, MD  [address]  [place], [place] [address]  [phone]

## 2023-02-04 NOTE — ED Triage Notes (Signed)
BP was 170/112 1 hour ago at home and it has been doing this for the past 3 days. Reports feeling off balance when going from sitting to standing and nauseous.

## 2023-02-04 NOTE — ED Nurses Note (Signed)
Patient discharged home with family. AVS reviewed with patient. A written copy of the AVS and discharge instructions were given to the patient. Questions sufficiently answered as needed. IV removed, intact, pressure dressing applied, no bleeding noted. Patient encouraged to follow up with PCP as indicated. In the event of an emergency, patient instructed to call 911 or go to the nearest emergency room. Patient left department via ambulation.

## 2023-02-04 NOTE — ED Provider Notes (Signed)
[facility]  ED Primary Provider Note  Patient Name: Adrienne Barton  Patient Age: 82 y.o.  Date of Birth: [birth year]    Chief Complaint: Dizziness        History of Present Illness       Adrienne Barton is a 82 y.o. female who had concerns including Dizziness.     HPI      chief complaint:  Dizziness    HPI  Location generalized   Onset acute  Timing:  2 to 3 days.  However her blood pressure has been elevated 3 days.  Duration intermittent  Modifying factors:  Patient states it is really only when she stands up from a seated position that it occurs.  Additional symptoms:  Patient has anterior neck pain.  She states she also has chest pain in the anterior portion of the chest midline.  She states this is been intermittent for the past 3 days.  She states it is achy in quality and rated 2/10.  She states that this pain is constant today but intermittent prior to this.  Additionally, the patient has had nausea no vomiting she has had diaphoresis.  She has no syncope or presyncopal episodes but she states that she does have dizziness when standing up.  Patient also states her blood pressure has been up for the past 3 days.  Here it was 170/112.  She states it is normally around 135/80.  Mechanism:  Patient does have risk factors for pulmonary embolism.  She also has risk factors for coronary artery disease.  Deep vein thrombosis/PE: Patient has a history of hairy cell leukemia.  She has never smoked.  She is 82 years old.  The patient has no immobility.  No recent surgeries or orthopedic injuries.  She has no personal history of deep vein thrombosis/PE.  Risk factors for coronary artery disease: Hypertension, age, obesity    PMH: reviewed and listed on the chart below.   SH: reviewed and listed on the chart below.   Social History: reviewed and listed on the chart below.   Family History: reviewed and listed on the chart below.       PMHx:    Past Medical History:   Diagnosis Date    DJD  (degenerative joint disease)     Hairy cell leukemia (CMS HCC)     History of basal cell cancer     Hypertension     Hypothyroidism     Osteoarthritis     PSHx:   Past Surgical History:   Procedure Laterality Date    ESOPHAGUS SURGERY      GALLBLADDER SURGERY      HX HERNIA REPAIR      KNEE SURGERY Right     TONSILLECTOMY         Allergies:    Allergies   Allergen Reactions    Adhesive Rash     Both paper and adhesive tape cause blisters    Social History  Social History     Tobacco Use    Smoking status: Never    Smokeless tobacco: Never   Vaping Use    Vaping status: Never Used   Substance Use Topics    Alcohol use: Not Currently    Drug use: Never       Family History  Family Medical History:       Problem Relation (Age of Onset)    Breast Cancer Mother    Cancer Father, Sister  Colon Cancer Sister    Kidney Cancer Brother    Polycythemia Sister               Home Meds:      Prior to Admission medications    Medication Sig Start Date End Date Taking? Authorizing Provider   aspirin (ECOTRIN) 81 mg Oral Tablet, Delayed Release (E.C.) Take 1 Tablet (81 mg total) by mouth Once a day 06/13/14  Yes Provider, Historical   buPROPion (WELLBUTRIN SR) 150 mg Oral tablet sustained-release 12 hr Take 1 Tablet (150 mg total) by mouth Twice daily   Yes Provider, Historical   busPIRone (BUSPAR) 5 mg Oral Tablet Take 1 Tablet (5 mg total) by mouth Twice per day as needed for Other   Yes Provider, Historical   celecoxib (CELEBREX) 100 mg Oral Capsule Take 1 Capsule (100 mg total) by mouth Twice daily 06/13/14  Yes Provider, Historical   DOCOSAHEXAENOIC ACID ORAL Take 1 g by mouth Twice daily 06/13/14  Yes Provider, Historical   DULoxetine (CYMBALTA DR) 60 mg Oral Capsule, Delayed Release(E.C.) Take 1 Capsule (60 mg total) by mouth Once a day   Yes Provider, Historical   levothyroxine (SYNTHROID) 200 mcg Oral Tablet Take 1 Tablet (200 mcg total) by mouth Every morning   Yes Provider, Historical   levothyroxine (SYNTHROID) 75 mcg  Oral Tablet Take 1 Tablet (75 mcg total) by mouth Every morning   Yes Provider, Historical   losartan (COZAAR) 50 mg Oral Tablet Take 1 Tablet (50 mg total) by mouth Once a day 06/13/14  Yes Provider, Historical          ROS: A total of 10 systems were reviewed by me at the time of the visit. All are negative except the HPI and the following.           Physical Exam   ED Triage Vitals [02/04/23 1512]   BP (Non-Invasive) (!) 168/93   Heart Rate 91   Respiratory Rate 18   Temperature 36.6 C (97.9 F)   SpO2 97 %   Weight 134 kg (295 lb)   Height 1.6 m (5\' 3" )     Orthostatic vital signs   Supine 159/113  85    Sitting 183/144  94    Standing 179/118 105    Physical exam  Constitutional/general: The nursing notes were reviewed and agreed upon.  The vital signs were reviewed and are listed on the chart.   82 year old female who appears to be younger than her stated age.  She is currently  in no acute distress.  Patient appears to be comfortable and in no pain.  HEENT: Eyes show normal extraocular movements.  Well-hydrated oral mucosa is noted.  There is no facial trauma or abnormalities.  Neck: No midline tenderness, no meningeal signs.  Full range of motion.  No JVD.    Cardiovascular: Heart is regular rate and rhythm S1-S2 sounds were auscultated without murmur click or rub  Respiratory: Lungs are clear to auscultation in all fields without wheeze rale or rhonchi.  GI: Abdomen is soft non tender normal bowel sounds are auscultated.  There is no rebound tenderness or guarding.  Obese abdomen with a BMI of 53.  Neuro cranial nerves II through XII are intact and normal.  Patient has normal speech and normal gait.  There is no muscle weakness in any extremities.  Sensation is intact throughout.   Psych: Patient is alert and oriented person place and time.  Patient is very pleasant  converse with has a euthymic affect.  There are no signs of depression or anxiety.  Skin: No rash.  No petechiae or purpura.  The skin is warm  and dry without diaphoresis.  There is no pallor.  Musculoskeletal: There is no tenderness to palpation in any body region.  There is no lower extremity edema and no calf tenderness.  Negative Homan's sign bilaterally.  GU: Deferred        Procedures      Patient Data     Labs Ordered/Reviewed   COMPREHENSIVE METABOLIC PANEL, NON-FASTING - Abnormal; Notable for the following components:       Result Value    BUN 28 (*)     BUN/CREA RATIO 28 (*)     ESTIMATED GFR 57 (*)     All other components within normal limits    Narrative:     Estimated Glomerular Filtration Rate (eGFR) is calculated using the CKD-EPI (2021) equation, intended for patients 60 years of age and older. If gender is not documented or "unknown", there will be no eGFR calculation.     MAGNESIUM - Abnormal; Notable for the following components:    MAGNESIUM 1.8 (*)     All other components within normal limits   CBC WITH DIFF - Abnormal; Notable for the following components:    HCT 42.4 (*)     All other components within normal limits   TROPONIN-I - Normal   TROPONIN-I - Normal   CBC/DIFF    Narrative:     The following orders were created for panel order CBC/DIFF.  Procedure                               Abnormality         Status                     ---------                               -----------         ------                     CBC WITH [id number]]                Abnormal            Final result                 Please view results for these tests on the individual orders.   EXTRA TUBES    Narrative:     The following orders were created for panel order EXTRA TUBES.  Procedure                               Abnormality         Status                     ---------                               -----------         ------                     BLUE TOP  ZOXW[[id number]]                                    In process                 GOLD TOP [id number]]                                    In process                   Please view results for  these tests on the individual orders.   BLUE TOP TUBE   GOLD TOP TUBE       BMP:   138 (05/31 1548) 107 (05/31 1548) 28* (05/31 1548)    /     101 (05/31 1548)   4.4 (05/31 1548) 27 (05/31 1548) 1.01 (05/31 1548) \              CBC:     8.5 (05/31 1548) \   14.0 (05/31 1548) /   221 (05/31 1548)      / 42.4* (05/31 1548) \             No orders to display                Medical Decision Making  Amount and/or Complexity of Data Reviewed  Labs: ordered. Decision-making details documented in ED Course.  ECG/medicine tests: ordered and independent interpretation performed. Decision-making details documented in ED Course.     Details: EKG is interpreted by me at 15:50.  This will be reviewed by cardiologist later time.    Leftward axis but still within the normal range.  Normal R-wave progression across the anterior precordial leads.  P-waves proceed each QRS complex.  Normal PR interval noted.  Normal  QTC interval but still within the normal range.  There is no ST elevations no ST depressions.  No Q-waves noted.  Normal sinus rhythm at 90 beats per minute    Risk  Decision regarding hospitalization.              ED Course as of 02/04/23 1848   Fri Feb 04, 2023   1843 CBC/DIFF(!)   1843 COMPREHENSIVE METABOLIC PANEL, NON-FASTING(!)   1843 BUN(!): 28   1843 CREATININE: 1.01   1843 TROPONIN-I: 7   1843 Trop is 7 x 2 readings     1845 ECG 12 LEAD  EKG is interpreted by me at 15:50.  This will be reviewed by cardiologist later time.    Normal axis.  Normal R-wave progression across the anterior precordial leads.  Normal PR and QT/QTC interval.  There are no ST elevations no ST depressions.  Normal sinus rhythm 90 beats per minute.         Medications Administered in the ED   magnesium sulfate 1 G in D5W 100 mL premix IVPB (has no administration in time range)   NS bolus infusion 500 mL (500 mL Intravenous New Bag/New Syringe 02/04/23 1800)                 Clinical Impression   Precordial pain (Primary)   Dizziness   Prerenal  azotemia           Discharged      Current Discharge Medication List  _______________________________  Alphonzo Cruise, D.O.  Emergency Medicine   [facility]          This note may have been partially generated using MModal Fluency Direct system, and there may be some incorrect words, spellings, and punctuation that were not noted in checking the note before saving, though effort was made to avoid such errors.

## 2023-02-11 DIAGNOSIS — R079 Chest pain, unspecified: Secondary | ICD-10-CM

## 2023-02-11 LAB — ECG 12 LEAD
Atrial Rate: 89 {beats}/min
Calculated P Axis: 36 degrees
Calculated R Axis: 20 degrees
Calculated T Axis: 82 degrees
PR Interval: 170 ms
QRS Duration: 82 ms
QT Interval: 376 ms
QTC Calculation: 457 ms
Ventricular rate: 89 {beats}/min

## 2023-11-21 ENCOUNTER — Other Ambulatory Visit: Payer: Self-pay

## 2023-11-21 ENCOUNTER — Other Ambulatory Visit (HOSPITAL_COMMUNITY): Payer: Self-pay | Admitting: Orthopaedic Surgery

## 2023-11-21 ENCOUNTER — Ambulatory Visit
Admission: RE | Admit: 2023-11-21 | Discharge: 2023-11-21 | Disposition: A | Discharge status: Home / Self Care | Source: Ambulatory Visit | Attending: Orthopaedic Surgery

## 2023-11-21 DIAGNOSIS — Z01818 Encounter for other preprocedural examination: Secondary | ICD-10-CM | POA: Insufficient documentation

## 2023-11-21 DIAGNOSIS — I491 Atrial premature depolarization: Secondary | ICD-10-CM

## 2023-11-21 DIAGNOSIS — Z01810 Encounter for preprocedural cardiovascular examination: Secondary | ICD-10-CM

## 2023-11-21 LAB — ECG 12 LEAD
Atrial Rate: 94 {beats}/min
Calculated P Axis: 58 degrees
Calculated R Axis: 64 degrees
Calculated T Axis: 90 degrees
PR Interval: 172 ms
QRS Duration: 84 ms
QT Interval: 352 ms
QTC Calculation: 440 ms
Ventricular rate: 94 {beats}/min

## 2023-12-30 ENCOUNTER — Encounter (INDEPENDENT_AMBULATORY_CARE_PROVIDER_SITE_OTHER): Payer: Self-pay | Admitting: Surgery

## 2024-01-03 ENCOUNTER — Other Ambulatory Visit: Payer: Self-pay

## 2024-01-03 ENCOUNTER — Encounter (INDEPENDENT_AMBULATORY_CARE_PROVIDER_SITE_OTHER): Payer: Self-pay | Admitting: Surgery

## 2024-01-03 ENCOUNTER — Ambulatory Visit (INDEPENDENT_AMBULATORY_CARE_PROVIDER_SITE_OTHER): Payer: Self-pay | Admitting: Surgery

## 2024-01-03 VITALS — BP 139/74 | HR 98 | Temp 98.5°F | Wt 249.0 lb

## 2024-01-03 DIAGNOSIS — Z872 Personal history of diseases of the skin and subcutaneous tissue: Secondary | ICD-10-CM

## 2024-01-03 DIAGNOSIS — L905 Scar conditions and fibrosis of skin: Secondary | ICD-10-CM

## 2024-01-03 NOTE — Progress Notes (Signed)
 GENERAL SURGERY, [facility]  [address]  [place] [address]    History and Physical    Name: Adrienne Barton MRN:  [id number]   Date: 01/03/2024 DOB:  04-13-1941 (82 y.o.)              Reason for Visit: Pilonidal Cyst    History of Present Illness  Ms. Tollefsen presents today for evaluation for a possible pilonidal cyst. She denies any abscess. She does sometimes notice that she has a little thickening in the sacral area when she sits down occasionally on that area.       Review of the result(s) of each unique test:  Patient underwent diagnostic testing ( none ) prior to this dates visit.  I have personally reviewed the results and that serves as a component of the medical decision making for this encounter       Review of prior external note(s) from each unique source:  Patients referral to this office including a recent assessment by the referring provider.  This was reviewed by me for this unique office visit for the indication and intent of the referral as well as any pertinent medical or surgical history relevant to the patients independent evaluation by me today.      Patient History  Past Medical History:   Diagnosis Date    DJD (degenerative joint disease)     Hairy cell leukemia     History of basal cell cancer     Hypertension     Hypothyroidism     Osteoarthritis          Past Surgical History:   Procedure Laterality Date    ESOPHAGUS SURGERY      GALLBLADDER SURGERY      HX HERNIA REPAIR      KNEE SURGERY Right     TONSILLECTOMY           Current Outpatient Medications   Medication Sig    aspirin (ECOTRIN) 81 mg Oral Tablet, Delayed Release (E.C.) Take 1 Tablet (81 mg total) by mouth Daily    buPROPion (WELLBUTRIN SR) 150 mg Oral tablet sustained-release 12 hr Take 1 Tablet (150 mg total) by mouth Twice daily    busPIRone (BUSPAR) 5 mg Oral Tablet Take 1 Tablet (5 mg total) by mouth Twice per day as needed for Other    celecoxib (CELEBREX) 100 mg Oral Capsule Take 1  Capsule (100 mg total) by mouth Twice daily    DOCOSAHEXAENOIC ACID ORAL Take 1 g by mouth Twice daily    DULoxetine (CYMBALTA DR) 60 mg Oral Capsule, Delayed Release(E.C.) Take 1 Capsule (60 mg total) by mouth Daily    levothyroxine (SYNTHROID) 200 mcg Oral Tablet Take 1 Tablet (200 mcg total) by mouth Every morning    levothyroxine (SYNTHROID) 75 mcg Oral Tablet Take 1 Tablet (75 mcg total) by mouth Every morning    losartan (COZAAR) 50 mg Oral Tablet Take 1 Tablet (50 mg total) by mouth Daily     Allergies   Allergen Reactions    Adhesive Rash     Both paper and adhesive tape cause blisters     Family Medical History:       Problem Relation (Age of Onset)    Breast Cancer Mother    Cancer Father, Sister    Colon Cancer Sister    Kidney Cancer Brother    Polycythemia Sister            Social History  Tobacco Use    Smoking status: Never    Smokeless tobacco: Never   Vaping Use    Vaping status: Never Used   Substance Use Topics    Alcohol use: Not Currently    Drug use: Never            Physical Examination:  Vitals:    01/03/24 1112   BP: 139/74   Pulse: 98   Temp: 36.9 C (98.5 F)   SpO2: 95%   Weight: 113 kg (249 lb)        General: appropriate for age. in no acute distress.    Vital signs are present above and have been reviewed by me     HEENT: Atraumatic, Normocephalic. PERRLA. EOMI. Nose clear. Throat clear    Lungs: Nonlabored breathing with symmetric expansion. Clear to auscultation bilaterally    Heart:Regular wth respect to rate and rythmn.    Abdomen:Soft. Nontender. Nondistended and benign    Sacral examination shows scarring from back surgery but I do not see any mass that requires excision    Extremities: Grossly normal. No major deformities     Neuro:  Grossly normal motor and sensory function    Psychiatric: Alert and oriented to person, place, and time. affect appropriate      Assessment and Plan  Thickened scar from prior back surgery  Possible cyst by history however I do not see any  evidence for the need for excision  Patient was given instructions to continue local care to the area if it gets sensitive  RTC prn  Patient understood and agreed. She was very appreciative for the evaluation.      Follow Up:  No follow-ups on file.      ICD-10-CM    1. Hx of pilonidal cyst  Z87.2           Lutie Pickler B Kota Ciancio, MD ,MBA,FACS    I appreciate the opportunity to be involved in the care of your patients.  If you have any questions or concerns regarding this encounter, please do not hesitate to contact me at your convenience.      This note may have been partially generated using MModal Fluency Direct system, and there may be some incorrect words, spellings, and punctuation that were not noted in checking the note before saving, though effort was made to avoid such errors.

## 2024-01-26 ENCOUNTER — Telehealth (HOSPITAL_COMMUNITY): Payer: Self-pay

## 2024-01-26 ENCOUNTER — Encounter (HOSPITAL_COMMUNITY)
Admission: RE | Disposition: A | Discharge status: Home / Self Care | Payer: Self-pay | Source: Ambulatory Visit | Attending: Orthopaedic Surgery

## 2024-01-26 ENCOUNTER — Ambulatory Visit
Admission: RE | Admit: 2024-01-26 | Discharge: 2024-01-26 | Disposition: A | Discharge status: Home / Self Care | Payer: Self-pay | Source: Ambulatory Visit | Attending: Orthopaedic Surgery | Admitting: Orthopaedic Surgery

## 2024-01-26 ENCOUNTER — Encounter (HOSPITAL_COMMUNITY): Payer: Self-pay | Admitting: Orthopaedic Surgery

## 2024-01-26 ENCOUNTER — Inpatient Hospital Stay (HOSPITAL_COMMUNITY): Admitting: Orthopaedic Surgery

## 2024-01-26 ENCOUNTER — Other Ambulatory Visit: Payer: Self-pay

## 2024-01-26 SURGERY — ARTHOPLASTY KNEE TOTAL MAKO ROBOTIC ASSISTED
Anesthesia: Other | Site: Knee | Laterality: Left | Wound class: Clean Wound: Uninfected operative wounds in which no inflammation occurred

## 2024-01-26 MED ORDER — APREPITANT 40 MG CAPSULE
40.0000 mg | ORAL_CAPSULE | Freq: Once | ORAL | Status: DC
Start: 2024-01-26 — End: 2024-01-26

## 2024-01-26 MED ORDER — TRANEXAMIC ACID 1,000 MG/10 ML (100 MG/ML) INTRAVENOUS SOLUTION
1000.0000 mg | Freq: Once | INTRAVENOUS | Status: DC
Start: 2024-01-26 — End: 2024-01-26

## 2024-01-26 MED ORDER — OXYCODONE ER 10 MG TABLET,CRUSH RESISTANT,EXTENDED RELEASE 12 HR
10.0000 mg | EXTENDED_RELEASE_ORAL_TABLET | Freq: Once | ORAL | Status: DC
Start: 2024-01-26 — End: 2024-01-26

## 2024-01-26 MED ORDER — DEXAMETHASONE SODIUM PHOSPHATE (PF) 10 MG/ML INJECTION SOLUTION
10.0000 mg | Freq: Once | INTRAMUSCULAR | Status: DC
Start: 2024-01-26 — End: 2024-01-26

## 2024-01-26 MED ORDER — ONDANSETRON HCL (PF) 4 MG/2 ML INJECTION SOLUTION
8.0000 mg | Freq: Once | INTRAMUSCULAR | Status: DC
Start: 2024-01-26 — End: 2024-01-26

## 2024-01-26 MED ORDER — SODIUM CHLORIDE 0.9 % INTRAVENOUS PIGGYBACK
2.0000 g | Freq: Once | INTRAVENOUS | Status: DC
Start: 2024-01-26 — End: 2024-01-26

## 2024-01-26 MED ORDER — LACTATED RINGERS INTRAVENOUS SOLUTION
INTRAVENOUS | Status: DC
Start: 2024-01-26 — End: 2024-01-26

## 2024-01-26 MED ORDER — SODIUM CHLORIDE 0.9 % (FLUSH) INJECTION SYRINGE
3.0000 mL | INJECTION | Freq: Three times a day (TID) | INTRAMUSCULAR | Status: DC
Start: 2024-01-26 — End: 2024-01-26

## 2024-01-26 MED ORDER — SODIUM CHLORIDE 0.9 % (FLUSH) INJECTION SYRINGE
3.0000 mL | INJECTION | INTRAMUSCULAR | Status: DC | PRN
Start: 2024-01-26 — End: 2024-01-26

## 2024-01-26 MED ORDER — NALOXONE 0.4 MG/ML INJECTION SOLUTION
0.4000 mg | INTRAMUSCULAR | Status: DC | PRN
Start: 2024-01-26 — End: 2024-01-26

## 2024-01-26 MED ORDER — ACETAMINOPHEN 325 MG TABLET
975.0000 mg | ORAL_TABLET | Freq: Once | ORAL | Status: DC
Start: 2024-01-26 — End: 2024-01-26

## 2024-01-26 MED ORDER — CELECOXIB 100 MG CAPSULE
200.0000 mg | ORAL_CAPSULE | Freq: Once | ORAL | Status: DC
Start: 2024-01-26 — End: 2024-01-26

## 2024-01-26 MED ORDER — ETHYL ALCOHOL 62 % TOPICAL SWAB
1.0000 | Freq: Once | CUTANEOUS | Status: DC
Start: 2024-01-26 — End: 2024-01-26

## 2024-01-26 NOTE — Interval H&P Note (Signed)
 H & P updated the day of the procedure.  1.  H&P completed within 30 days of surgical procedure and has been reviewed within 24 hours of admission but prior to surgery or a procedure requiring anesthesia services, the patient has been examined, and no change has occured in the patients condition since the H&P was completed.       Change in medications: No        No LMP recorded.      Comments:     2.  Patient continues to be appropriate candidate for planned surgical procedure. YES    Quenton Fetter, DO

## 2024-01-26 NOTE — Nurses Notes (Signed)
 Red areas noted at the bend of the elbow on the right arm.  Patient states she had a spider bite there and is being treated for it w/ Keflex.  Patient also states Dr India Mandes office is aware.  I contacted Dr Britta Candy regarding redness and patient's surgery is cancelled.  Instructed patient to call the office for further instructions regarding rescheduling.

## 2024-01-26 NOTE — H&P (Signed)
 Paper H and P on chart, will be scanned to EMR.

## 2024-01-27 ENCOUNTER — Other Ambulatory Visit: Payer: Self-pay

## 2024-01-27 ENCOUNTER — Emergency Department
Admission: EM | Admit: 2024-01-27 | Discharge: 2024-01-27 | Disposition: A | Discharge status: Home / Self Care | Attending: Emergency Medicine | Admitting: Emergency Medicine

## 2024-01-27 ENCOUNTER — Encounter (HOSPITAL_BASED_OUTPATIENT_CLINIC_OR_DEPARTMENT_OTHER): Payer: Self-pay

## 2024-01-27 DIAGNOSIS — L259 Unspecified contact dermatitis, unspecified cause: Secondary | ICD-10-CM | POA: Insufficient documentation

## 2024-01-27 DIAGNOSIS — E66813 Obesity, class 3: Secondary | ICD-10-CM | POA: Insufficient documentation

## 2024-01-27 DIAGNOSIS — T7840XA Allergy, unspecified, initial encounter: Secondary | ICD-10-CM

## 2024-01-27 DIAGNOSIS — Z6841 Body Mass Index (BMI) 40.0 and over, adult: Secondary | ICD-10-CM | POA: Insufficient documentation

## 2024-01-27 MED ORDER — DIPHENHYDRAMINE 25 MG CAPSULE
50.0000 mg | ORAL_CAPSULE | ORAL | Status: AC
Start: 2024-01-27 — End: 2024-01-27
  Administered 2024-01-27: 50 mg via ORAL

## 2024-01-27 MED ORDER — DIPHENHYDRAMINE 25 MG CAPSULE
ORAL_CAPSULE | ORAL | Status: AC
Start: 2024-01-27 — End: 2024-01-27
  Filled 2024-01-27: qty 2

## 2024-01-27 MED ORDER — FAMOTIDINE 20 MG TABLET
20.0000 mg | ORAL_TABLET | Freq: Two times a day (BID) | ORAL | 0 refills | Status: DC
Start: 2024-01-27 — End: 2024-02-15

## 2024-01-27 MED ORDER — METHYLPREDNISOLONE ACETATE 80 MG/ML SUSPENSION FOR INJECTION
80.0000 mg | Freq: Once | INTRAMUSCULAR | Status: AC
Start: 2024-01-27 — End: 2024-01-27
  Administered 2024-01-27: 80 mg via INTRAMUSCULAR

## 2024-01-27 MED ORDER — FAMOTIDINE 20 MG TABLET
20.0000 mg | ORAL_TABLET | ORAL | Status: AC
Start: 2024-01-27 — End: 2024-01-27
  Administered 2024-01-27: 20 mg via ORAL

## 2024-01-27 MED ORDER — FAMOTIDINE 20 MG TABLET
ORAL_TABLET | ORAL | Status: AC
Start: 2024-01-27 — End: 2024-01-27
  Filled 2024-01-27: qty 1

## 2024-01-27 MED ORDER — METHYLPREDNISOLONE ACETATE 80 MG/ML SUSPENSION FOR INJECTION
INTRAMUSCULAR | Status: AC
Start: 2024-01-27 — End: 2024-01-27
  Filled 2024-01-27: qty 1

## 2024-01-27 MED ORDER — DIPHENHYDRAMINE 50 MG CAPSULE
50.0000 mg | ORAL_CAPSULE | Freq: Four times a day (QID) | ORAL | 0 refills | Status: DC | PRN
Start: 2024-01-27 — End: 2024-02-15

## 2024-01-27 NOTE — ED Nurses Note (Signed)

## 2024-01-27 NOTE — ED Provider Notes (Signed)
 [facility], [facility] - Emergency Department  ED Primary Provider Note  History of Present Illness   Chief Complaint   Patient presents with    Rash     Pt reports a spider bite to left arm that is itchy and burns. States the rash appeared on Tuesday.      Adrienne Barton is a 83 y.o. female who had concerns including Rash.  Arrival: The patient arrived by Car patient states she was outside the last couple days and her husband was cutting up bushes in the backyard.  She then started to develop rash on her left arm which has now progressed down the arm as well as up the arm.  She denies anything biting her.  She denies any shortness breath or chest pain.  No swelling of her lips or mouth.  No difficulty swallowing.  Patient saw her PMD who started her on Keflex and stated that it could have been a spider bite.    HPI  Review of Systems   Review of Systems   Constitutional:  Positive for activity change and appetite change. Negative for chills and fever.   HENT:  Negative for ear pain and sore throat.    Eyes:  Negative for pain and visual disturbance.   Respiratory:  Negative for cough and shortness of breath.    Cardiovascular:  Negative for chest pain and palpitations.   Gastrointestinal:  Negative for abdominal pain and vomiting.   Genitourinary:  Negative for dysuria and hematuria.   Musculoskeletal:  Negative for arthralgias and back pain.   Skin:  Positive for rash and wound. Negative for color change.   Neurological:  Negative for seizures and syncope.   All other systems reviewed and are negative.     Historical Data   History Reviewed This Encounter:     Physical Exam   ED Triage Vitals [01/27/24 1103]   BP (Non-Invasive) (!) 152/81   Heart Rate (!) 102   Respiratory Rate 20   Temperature 36.6 C (97.9 F)   SpO2 96 %   Weight 113 kg (249 lb)   Height 1.6 m (5\' 3" )     Physical Exam  Vitals and nursing note reviewed.   Constitutional:       General: She is not in acute distress.      Appearance: She is well-developed. She is obese.   HENT:      Head: Normocephalic and atraumatic.      Right Ear: External ear normal.      Left Ear: External ear normal.      Nose: Nose normal.      Mouth/Throat:      Mouth: Mucous membranes are dry.   Eyes:      Extraocular Movements: Extraocular movements intact.      Conjunctiva/sclera: Conjunctivae normal.      Pupils: Pupils are equal, round, and reactive to light.   Cardiovascular:      Rate and Rhythm: Normal rate and regular rhythm.      Pulses: Normal pulses.      Heart sounds: Normal heart sounds. No murmur heard.  Pulmonary:      Effort: Pulmonary effort is normal. No respiratory distress.      Breath sounds: Normal breath sounds.   Abdominal:      General: Bowel sounds are normal.      Palpations: Abdomen is soft.      Tenderness: There is no abdominal tenderness.   Musculoskeletal:  General: No swelling. Normal range of motion.      Cervical back: Normal range of motion and neck supple.   Skin:     General: Skin is warm and dry.      Capillary Refill: Capillary refill takes less than 2 seconds.      Findings: Erythema and rash present.      Comments: Positive erythematous vesicular rash on the left any cubital area radiating down the forearm and up humerus.  Positive itching.  There is no central lesion or ulceration.   Neurological:      General: No focal deficit present.      Mental Status: She is alert and oriented to person, place, and time.   Psychiatric:         Mood and Affect: Mood normal.         Behavior: Behavior normal.         Thought Content: Thought content normal.       Patient Data   Labs Ordered/Reviewed - No data to display  No orders to display     Medical Decision Making        Medical Decision Making  Patient is 83 year old white female complaining of an itching rash on her left arm after spending several days outs with her husband him while he was cutting bushes.  Patient denies being bitten by anything.  Patient saw her  PMD couple days ago and was started on Keflex thinking it was a spider bite.  Patient denies being bitten.  Patient states the rash is spreading on her arm.  Patient will receive Depo-Medrol shot as well as Benadryl and Pepcid.  Patient was told to use hydrocortisone cream on the rash.  Patient will follow up with her PMD in the next several days.    Risk  OTC drugs.  Prescription drug management.             Medications Ordered/Administered in the ED   methylPREDNISolone acetate (DEPO-medrol) 80 mg/mL injection (has no administration in time range)   diphenhydrAMINE (BENADRYL) capsule (has no administration in time range)   famotidine (PEPCID) tablet (has no administration in time range)     Clinical Impression   Contact dermatitis and eczema (Primary)   Allergic reaction       Disposition: Discharged               Clinical Impression   Contact dermatitis and eczema (Primary)   Allergic reaction       Current Discharge Medication List        START taking these medications    Details   diphenhydrAMINE (BENADRYL) 50 mg Oral Capsule Take 1 Capsule (50 mg total) by mouth Every 6 hours as needed for up to 7 days  Qty: 28 Capsule, Refills: 0      famotidine (PEPCID) 20 mg Oral Tablet Take 1 Tablet (20 mg total) by mouth Twice daily for 7 days  Qty: 14 Tablet, Refills: 0

## 2024-02-10 ENCOUNTER — Ambulatory Visit: Attending: Orthopaedic Surgery

## 2024-02-10 ENCOUNTER — Other Ambulatory Visit: Payer: Self-pay

## 2024-02-10 DIAGNOSIS — R82991 Hypocitraturia: Secondary | ICD-10-CM | POA: Insufficient documentation

## 2024-02-10 DIAGNOSIS — Z01818 Encounter for other preprocedural examination: Secondary | ICD-10-CM | POA: Insufficient documentation

## 2024-02-10 LAB — URINALYSIS, MACROSCOPIC
BILIRUBIN: NEGATIVE mg/dL
BLOOD: NEGATIVE mg/dL
GLUCOSE: NEGATIVE mg/dL
KETONES: NEGATIVE mg/dL
LEUKOCYTES: 75 WBCs/uL — AB
NITRITE: NEGATIVE
PH: 6.5 (ref 5.0–9.0)
PROTEIN: NEGATIVE mg/dL
SPECIFIC GRAVITY: 1.017 (ref 1.002–1.030)
UROBILINOGEN: NORMAL mg/dL

## 2024-02-10 LAB — URINALYSIS, MICROSCOPIC
RBCS: 1 /HPF (ref <4)
SQUAMOUS EPITHELIAL: 7 /HPF (ref <28)
WBCS: 2 /HPF (ref <6)

## 2024-02-10 LAB — BASIC METABOLIC PANEL
ANION GAP: 1 mmol/L — ABNORMAL LOW (ref 4–13)
BUN/CREA RATIO: 36 — ABNORMAL HIGH (ref 6–22)
BUN: 25 mg/dL (ref 7–25)
CALCIUM: 9.5 mg/dL (ref 8.6–10.3)
CHLORIDE: 107 mmol/L (ref 98–107)
CO2 TOTAL: 28 mmol/L (ref 21–31)
CREATININE: 0.7 mg/dL (ref 0.60–1.30)
ESTIMATED GFR: 87 mL/min/{1.73_m2} (ref >59)
GLUCOSE: 86 mg/dL (ref 74–109)
OSMOLALITY, CALCULATED: 276 mosm/kg (ref 270–290)
POTASSIUM: 4.3 mmol/L (ref 3.5–5.1)
SODIUM: 136 mmol/L (ref 136–145)

## 2024-02-10 LAB — CBC
HCT: 42.9 % — ABNORMAL HIGH (ref 31.2–41.9)
HGB: 14.1 g/dL (ref 10.9–14.3)
MCH: 29.3 pg (ref 24.7–32.8)
MCHC: 32.9 g/dL (ref 32.3–35.6)
MCV: 89.2 fL (ref 75.5–95.3)
MPV: 8.6 fL (ref 7.9–10.8)
PLATELETS: 245 10*3/uL (ref 140–440)
RBC: 4.81 10*6/uL (ref 3.63–4.92)
RDW: 14.6 % (ref 12.3–17.7)
WBC: 7.5 10*3/uL (ref 3.8–11.8)

## 2024-02-12 LAB — URINE CULTURE,ROUTINE: URINE CULTURE: 2000 — AB

## 2024-02-14 ENCOUNTER — Encounter (HOSPITAL_COMMUNITY): Payer: Self-pay | Admitting: Orthopaedic Surgery

## 2024-02-14 ENCOUNTER — Observation Stay (HOSPITAL_COMMUNITY): Admitting: Orthopaedic Surgery

## 2024-02-14 ENCOUNTER — Other Ambulatory Visit: Payer: Self-pay

## 2024-02-14 ENCOUNTER — Observation Stay
Admission: RE | Admit: 2024-02-14 | Discharge: 2024-02-15 | Disposition: A | Discharge status: Home / Self Care | Source: Ambulatory Visit | Attending: Orthopaedic Surgery | Admitting: Orthopaedic Surgery

## 2024-02-14 ENCOUNTER — Encounter (HOSPITAL_COMMUNITY)
Admission: RE | Disposition: A | Discharge status: Home / Self Care | Payer: Self-pay | Source: Ambulatory Visit | Attending: Orthopaedic Surgery

## 2024-02-14 ENCOUNTER — Inpatient Hospital Stay (HOSPITAL_COMMUNITY): Admitting: Certified Registered"

## 2024-02-14 DIAGNOSIS — I10 Essential (primary) hypertension: Secondary | ICD-10-CM | POA: Insufficient documentation

## 2024-02-14 DIAGNOSIS — E039 Hypothyroidism, unspecified: Secondary | ICD-10-CM | POA: Insufficient documentation

## 2024-02-14 DIAGNOSIS — Z856 Personal history of leukemia: Secondary | ICD-10-CM | POA: Insufficient documentation

## 2024-02-14 DIAGNOSIS — M1712 Unilateral primary osteoarthritis, left knee: Principal | ICD-10-CM | POA: Insufficient documentation

## 2024-02-14 DIAGNOSIS — Z96652 Presence of left artificial knee joint: Principal | ICD-10-CM

## 2024-02-14 DIAGNOSIS — M1990 Unspecified osteoarthritis, unspecified site: Secondary | ICD-10-CM | POA: Diagnosis present

## 2024-02-14 DIAGNOSIS — Z91048 Other nonmedicinal substance allergy status: Secondary | ICD-10-CM | POA: Insufficient documentation

## 2024-02-14 DIAGNOSIS — Z85828 Personal history of other malignant neoplasm of skin: Secondary | ICD-10-CM | POA: Insufficient documentation

## 2024-02-14 SURGERY — ARTHOPLASTY KNEE TOTAL MAKO ROBOTIC ASSISTED
Anesthesia: Spinal | Site: Knee | Laterality: Left | Wound class: Clean Wound: Uninfected operative wounds in which no inflammation occurred

## 2024-02-14 MED ORDER — FENTANYL (PF) 50 MCG/ML INJECTION WRAPPER
25.0000 ug | INJECTION | INTRAMUSCULAR | Status: DC | PRN
Start: 2024-02-14 — End: 2024-02-14
  Administered 2024-02-14 (×2): 25 ug via INTRAVENOUS

## 2024-02-14 MED ORDER — SODIUM CHLORIDE 0.9 % INTRAVENOUS SOLUTION
INTRAVENOUS | Status: DC
Start: 2024-02-14 — End: 2024-02-15

## 2024-02-14 MED ORDER — NALOXONE 0.4 MG/ML INJECTION SOLUTION
0.4000 mg | INTRAMUSCULAR | Status: DC | PRN
Start: 2024-02-14 — End: 2024-02-15

## 2024-02-14 MED ORDER — LEVOTHYROXINE 50 MCG TABLET
75.0000 ug | ORAL_TABLET | Freq: Every morning | ORAL | Status: DC
Start: 2024-02-15 — End: 2024-02-15
  Administered 2024-02-15: 75 ug via ORAL
  Filled 2024-02-14: qty 1

## 2024-02-14 MED ORDER — ONDANSETRON HCL (PF) 4 MG/2 ML INJECTION SOLUTION
INTRAMUSCULAR | Status: AC
Start: 2024-02-14 — End: 2024-02-14
  Filled 2024-02-14: qty 4

## 2024-02-14 MED ORDER — TRAMADOL 50 MG TABLET
50.0000 mg | ORAL_TABLET | Freq: Four times a day (QID) | ORAL | Status: DC
Start: 2024-02-14 — End: 2024-02-16
  Administered 2024-02-14: 50 mg via ORAL
  Administered 2024-02-14: 0 mg via ORAL
  Administered 2024-02-15 (×2): 50 mg via ORAL
  Filled 2024-02-14 (×3): qty 1

## 2024-02-14 MED ORDER — APREPITANT 40 MG CAPSULE
40.0000 mg | ORAL_CAPSULE | Freq: Once | ORAL | Status: AC
Start: 2024-02-14 — End: 2024-02-14
  Administered 2024-02-14: 40 mg via ORAL

## 2024-02-14 MED ORDER — GABAPENTIN 300 MG CAPSULE
ORAL_CAPSULE | ORAL | Status: AC
Start: 2024-02-14 — End: 2024-02-14
  Filled 2024-02-14: qty 1

## 2024-02-14 MED ORDER — CEFAZOLIN 1 GRAM SOLUTION FOR INJECTION
Freq: Once | INTRAMUSCULAR | Status: DC | PRN
Start: 2024-02-14 — End: 2024-02-14
  Administered 2024-02-14: 2000 mg via INTRAVENOUS

## 2024-02-14 MED ORDER — APREPITANT 40 MG CAPSULE
ORAL_CAPSULE | ORAL | Status: AC
Start: 2024-02-14 — End: 2024-02-14
  Filled 2024-02-14: qty 1

## 2024-02-14 MED ORDER — ACETAMINOPHEN 325 MG TABLET
650.0000 mg | ORAL_TABLET | ORAL | Status: DC | PRN
Start: 2024-02-14 — End: 2024-02-15

## 2024-02-14 MED ORDER — ASPIRIN 81 MG CHEWABLE TABLET
81.0000 mg | CHEWABLE_TABLET | Freq: Two times a day (BID) | ORAL | Status: DC
Start: 2024-02-15 — End: 2024-02-15
  Administered 2024-02-15: 81 mg via ORAL
  Filled 2024-02-14: qty 1

## 2024-02-14 MED ORDER — BUPIVACAINE (PF) 0.75 % (7.5 MG/ML) IN 8.25 % DEXTROSE INJECTION
Freq: Once | INTRAMUSCULAR | Status: AC | PRN
Start: 2024-02-14 — End: 2024-02-14
  Administered 2024-02-14: 1.6 mL via INTRATHECAL

## 2024-02-14 MED ORDER — HYDROMORPHONE 2 MG/ML INJECTION WRAPPER
0.2000 mg | INJECTION | INTRAMUSCULAR | Status: DC | PRN
Start: 2024-02-14 — End: 2024-02-15
  Administered 2024-02-14: 0.2 mg via INTRAVENOUS
  Filled 2024-02-14: qty 1

## 2024-02-14 MED ORDER — SODIUM CHLORIDE 0.9 % (FLUSH) INJECTION SYRINGE
3.0000 mL | INJECTION | INTRAMUSCULAR | Status: DC | PRN
Start: 2024-02-14 — End: 2024-02-15

## 2024-02-14 MED ORDER — OXYCODONE ER 10 MG TABLET,CRUSH RESISTANT,EXTENDED RELEASE 12 HR
EXTENDED_RELEASE_ORAL_TABLET | ORAL | Status: AC
Start: 2024-02-14 — End: 2024-02-14
  Filled 2024-02-14: qty 1

## 2024-02-14 MED ORDER — PROPOFOL 10 MG/ML INTRAVENOUS EMULSION
INTRAVENOUS | Status: DC | PRN
Start: 2024-02-14 — End: 2024-02-14
  Administered 2024-02-14: 50 ug/kg/min via INTRAVENOUS
  Administered 2024-02-14: 0 ug/kg/min via INTRAVENOUS

## 2024-02-14 MED ORDER — CHOLECALCIFEROL (VITAMIN D3) 25 MCG (1,000 UNIT) TABLET
1000.0000 [IU] | ORAL_TABLET | Freq: Every day | ORAL | Status: DC
Start: 2024-02-14 — End: 2024-02-15
  Administered 2024-02-15: 1000 [IU] via ORAL
  Filled 2024-02-14: qty 1

## 2024-02-14 MED ORDER — DEXAMETHASONE SODIUM PHOSPHATE (PF) 10 MG/ML INJECTION SOLUTION
10.0000 mg | Freq: Once | INTRAMUSCULAR | Status: AC
Start: 2024-02-14 — End: 2024-02-14
  Administered 2024-02-14: 10 mg via INTRAVENOUS

## 2024-02-14 MED ORDER — DEXAMETHASONE SODIUM PHOSPHATE (PF) 10 MG/ML INJECTION SOLUTION
8.0000 mg | Freq: Two times a day (BID) | INTRAMUSCULAR | Status: AC
Start: 2024-02-14 — End: 2024-02-15
  Administered 2024-02-14 – 2024-02-15 (×2): 8 mg via INTRAVENOUS
  Filled 2024-02-14 (×2): qty 1

## 2024-02-14 MED ORDER — NALOXONE 0.4 MG/ML INJECTION SOLUTION
0.4000 mg | INTRAMUSCULAR | Status: DC | PRN
Start: 2024-02-14 — End: 2024-02-14

## 2024-02-14 MED ORDER — SODIUM CHLORIDE 0.9 % (FLUSH) INJECTION SYRINGE
3.0000 mL | INJECTION | Freq: Three times a day (TID) | INTRAMUSCULAR | Status: DC
Start: 2024-02-14 — End: 2024-02-15
  Administered 2024-02-14 – 2024-02-15 (×2): 0 mL

## 2024-02-14 MED ORDER — ACETAMINOPHEN 325 MG TABLET
975.0000 mg | ORAL_TABLET | Freq: Once | ORAL | Status: AC
Start: 2024-02-14 — End: 2024-02-14
  Administered 2024-02-14: 975 mg via ORAL

## 2024-02-14 MED ORDER — FAMOTIDINE (PF) 20 MG/2 ML INTRAVENOUS SOLUTION
20.0000 mg | Freq: Once | INTRAVENOUS | Status: AC
Start: 2024-02-14 — End: 2024-02-14
  Administered 2024-02-14: 20 mg via INTRAVENOUS

## 2024-02-14 MED ORDER — LOSARTAN 50 MG TABLET
50.0000 mg | ORAL_TABLET | Freq: Every day | ORAL | Status: DC
Start: 2024-02-15 — End: 2024-02-15
  Administered 2024-02-15: 50 mg via ORAL
  Filled 2024-02-14: qty 1

## 2024-02-14 MED ORDER — TRANEXAMIC ACID 1000 MG IN NS 50 ML IVPB - ANES
Freq: Once | INTRAVENOUS | Status: DC | PRN
Start: 2024-02-14 — End: 2024-02-14
  Administered 2024-02-14: 1000 mg via INTRAVENOUS

## 2024-02-14 MED ORDER — PROCHLORPERAZINE EDISYLATE 10 MG/2 ML (5 MG/ML) INJECTION SOLUTION
5.0000 mg | Freq: Once | INTRAMUSCULAR | Status: DC | PRN
Start: 2024-02-14 — End: 2024-02-14

## 2024-02-14 MED ORDER — HYDROMORPHONE 2 MG/ML INJECTION WRAPPER
INJECTION | INTRAMUSCULAR | Status: AC
Start: 2024-02-14 — End: 2024-02-14
  Filled 2024-02-14: qty 1

## 2024-02-14 MED ORDER — MULTIVITAMIN WITH FOLIC ACID 400 MCG TABLET
1.0000 | ORAL_TABLET | Freq: Every day | ORAL | Status: DC
Start: 2024-02-14 — End: 2024-02-15
  Administered 2024-02-15: 1 via ORAL
  Filled 2024-02-14: qty 1

## 2024-02-14 MED ORDER — ROPIVACAINE (PF) 2 MG/ML (0.2 %) INJECTION SOLUTION
Freq: Once | INTRAMUSCULAR | Status: DC | PRN
Start: 2024-02-14 — End: 2024-02-14
  Administered 2024-02-14: 30 mL via INTRA_ARTICULAR

## 2024-02-14 MED ORDER — ONDANSETRON HCL (PF) 4 MG/2 ML INJECTION SOLUTION
4.0000 mg | Freq: Once | INTRAMUSCULAR | Status: DC | PRN
Start: 2024-02-14 — End: 2024-02-14

## 2024-02-14 MED ORDER — DULOXETINE 30 MG CAPSULE,DELAYED RELEASE
60.0000 mg | DELAYED_RELEASE_CAPSULE | Freq: Every day | ORAL | Status: DC
Start: 2024-02-14 — End: 2024-02-15
  Administered 2024-02-15: 60 mg via ORAL
  Filled 2024-02-14: qty 2

## 2024-02-14 MED ORDER — CELECOXIB 100 MG CAPSULE
ORAL_CAPSULE | ORAL | Status: AC
Start: 2024-02-14 — End: 2024-02-14
  Filled 2024-02-14: qty 2

## 2024-02-14 MED ORDER — OXYCODONE ER 10 MG TABLET,CRUSH RESISTANT,EXTENDED RELEASE 12 HR
10.0000 mg | EXTENDED_RELEASE_ORAL_TABLET | Freq: Once | ORAL | Status: AC
Start: 2024-02-14 — End: 2024-02-14
  Administered 2024-02-14: 10 mg via ORAL

## 2024-02-14 MED ORDER — ONDANSETRON HCL (PF) 4 MG/2 ML INJECTION SOLUTION
8.0000 mg | Freq: Once | INTRAMUSCULAR | Status: AC
Start: 2024-02-14 — End: 2024-02-14
  Administered 2024-02-14: 8 mg via INTRAVENOUS

## 2024-02-14 MED ORDER — DEXAMETHASONE SODIUM PHOSPHATE (PF) 10 MG/ML INJECTION SOLUTION
INTRAMUSCULAR | Status: AC
Start: 2024-02-14 — End: 2024-02-14
  Filled 2024-02-14: qty 1

## 2024-02-14 MED ORDER — MORPHINE 2 MG/ML INJECTION WRAPPER
2.0000 mg | INJECTION | INTRAMUSCULAR | Status: DC | PRN
Start: 2024-02-14 — End: 2024-02-15
  Administered 2024-02-14: 2 mg via INTRAVENOUS
  Filled 2024-02-14: qty 1

## 2024-02-14 MED ORDER — SODIUM CHLORIDE 0.9 % INTRAVENOUS PIGGYBACK
2.0000 g | Freq: Once | INTRAVENOUS | Status: DC
Start: 2024-02-14 — End: 2024-02-14

## 2024-02-14 MED ORDER — OXYCODONE-ACETAMINOPHEN 5 MG-325 MG TABLET
2.0000 | ORAL_TABLET | ORAL | Status: DC | PRN
Start: 2024-02-14 — End: 2024-02-15
  Administered 2024-02-14 (×2): 2 via ORAL
  Filled 2024-02-14 (×2): qty 2

## 2024-02-14 MED ORDER — KETOROLAC 30 MG/ML (1 ML) INJECTION SOLUTION
15.0000 mg | Freq: Three times a day (TID) | INTRAMUSCULAR | Status: DC
Start: 2024-02-14 — End: 2024-02-16
  Administered 2024-02-14 – 2024-02-15 (×2): 15 mg via INTRAVENOUS
  Filled 2024-02-14 (×2): qty 1

## 2024-02-14 MED ORDER — ALBUTEROL SULFATE 2.5 MG/3 ML (0.083 %) SOLUTION FOR NEBULIZATION
2.5000 mg | INHALATION_SOLUTION | Freq: Once | RESPIRATORY_TRACT | Status: DC | PRN
Start: 2024-02-14 — End: 2024-02-14

## 2024-02-14 MED ORDER — CEFAZOLIN 2 GRAM INTRAVENOUS SOLUTION
INTRAVENOUS | Status: AC
Start: 2024-02-14 — End: 2024-02-14
  Filled 2024-02-14: qty 14.71

## 2024-02-14 MED ORDER — FENTANYL (PF) 50 MCG/ML INJECTION SOLUTION
INTRAMUSCULAR | Status: AC
Start: 2024-02-14 — End: 2024-02-14
  Filled 2024-02-14: qty 2

## 2024-02-14 MED ORDER — TRANEXAMIC ACID 1,000 MG/10 ML (100 MG/ML) INTRAVENOUS SOLUTION
INTRAVENOUS | Status: AC
Start: 2024-02-14 — End: 2024-02-14
  Filled 2024-02-14: qty 10

## 2024-02-14 MED ORDER — ACETAMINOPHEN 325 MG TABLET
ORAL_TABLET | ORAL | Status: AC
Start: 2024-02-14 — End: 2024-02-14
  Filled 2024-02-14: qty 3

## 2024-02-14 MED ORDER — IPRATROPIUM 0.5 MG-ALBUTEROL 3 MG (2.5 MG BASE)/3 ML NEBULIZATION SOLN
3.0000 mL | INHALATION_SOLUTION | Freq: Once | RESPIRATORY_TRACT | Status: DC | PRN
Start: 2024-02-14 — End: 2024-02-14

## 2024-02-14 MED ORDER — ACETAMINOPHEN 325 MG TABLET
975.0000 mg | ORAL_TABLET | Freq: Four times a day (QID) | ORAL | Status: AC
Start: 2024-02-14 — End: 2024-02-15
  Administered 2024-02-14: 975 mg via ORAL
  Filled 2024-02-14: qty 3

## 2024-02-14 MED ORDER — ROPIVACAINE (PF) 2 MG/ML (0.2 %) INJECTION SOLUTION
INTRAMUSCULAR | Status: AC
Start: 2024-02-14 — End: 2024-02-14
  Filled 2024-02-14: qty 30

## 2024-02-14 MED ORDER — LACTATED RINGERS INTRAVENOUS SOLUTION
INTRAVENOUS | Status: DC
Start: 2024-02-14 — End: 2024-02-15

## 2024-02-14 MED ORDER — BISACODYL 10 MG RECTAL SUPPOSITORY
10.0000 mg | Freq: Every day | RECTAL | Status: DC | PRN
Start: 2024-02-14 — End: 2024-02-15

## 2024-02-14 MED ORDER — BUPROPION HCL SR 150 MG TABLET,12 HR SUSTAINED-RELEASE
150.0000 mg | ORAL_TABLET | Freq: Two times a day (BID) | ORAL | Status: DC
Start: 2024-02-14 — End: 2024-02-15
  Administered 2024-02-14 – 2024-02-15 (×2): 150 mg via ORAL
  Filled 2024-02-14 (×2): qty 1

## 2024-02-14 MED ORDER — OXYCODONE-ACETAMINOPHEN 5 MG-325 MG TABLET
1.0000 | ORAL_TABLET | ORAL | Status: DC | PRN
Start: 2024-02-14 — End: 2024-02-15
  Administered 2024-02-15: 1 via ORAL
  Filled 2024-02-14: qty 1

## 2024-02-14 MED ORDER — TRANEXAMIC ACID 1,000 MG/10 ML (100 MG/ML) INTRAVENOUS SOLUTION
1000.0000 mg | Freq: Once | INTRAVENOUS | Status: DC
Start: 2024-02-14 — End: 2024-02-14

## 2024-02-14 MED ORDER — LEVOTHYROXINE 100 MCG TABLET
200.0000 ug | ORAL_TABLET | Freq: Every morning | ORAL | Status: DC
Start: 2024-02-15 — End: 2024-02-15
  Administered 2024-02-15: 200 ug via ORAL
  Filled 2024-02-14: qty 2

## 2024-02-14 MED ORDER — LACTATED RINGERS INTRAVENOUS SOLUTION
INTRAVENOUS | Status: DC | PRN
Start: 2024-02-14 — End: 2024-02-14
  Administered 2024-02-14: 0 via INTRAVENOUS

## 2024-02-14 MED ORDER — DOCUSATE SODIUM 100 MG CAPSULE
100.0000 mg | ORAL_CAPSULE | Freq: Two times a day (BID) | ORAL | Status: DC
Start: 2024-02-14 — End: 2024-02-15
  Administered 2024-02-14 – 2024-02-15 (×2): 100 mg via ORAL
  Filled 2024-02-14 (×2): qty 1

## 2024-02-14 MED ORDER — ETHYL ALCOHOL 62 % TOPICAL SWAB
1.0000 | Freq: Once | CUTANEOUS | Status: AC
Start: 2024-02-14 — End: 2024-02-14
  Administered 2024-02-14: 1 via NASAL

## 2024-02-14 MED ORDER — FAMOTIDINE 20 MG TABLET
20.0000 mg | ORAL_TABLET | Freq: Two times a day (BID) | ORAL | Status: DC
Start: 2024-02-14 — End: 2024-02-16
  Administered 2024-02-14 – 2024-02-15 (×2): 20 mg via ORAL
  Filled 2024-02-14 (×2): qty 1

## 2024-02-14 MED ORDER — ETHYL ALCOHOL 62 % TOPICAL SWAB
1.0000 | Freq: Two times a day (BID) | CUTANEOUS | Status: DC
Start: 2024-02-14 — End: 2024-02-15
  Administered 2024-02-14 – 2024-02-15 (×2): 1 via NASAL

## 2024-02-14 MED ORDER — BUSPIRONE 5 MG TABLET
5.0000 mg | ORAL_TABLET | Freq: Every day | ORAL | Status: DC
Start: 2024-02-14 — End: 2024-02-15
  Administered 2024-02-15: 5 mg via ORAL
  Filled 2024-02-14: qty 1

## 2024-02-14 MED ORDER — FENTANYL (PF) 50 MCG/ML INJECTION WRAPPER
50.0000 ug | INJECTION | INTRAMUSCULAR | Status: DC | PRN
Start: 2024-02-14 — End: 2024-02-14
  Administered 2024-02-14: 50 ug via INTRAVENOUS

## 2024-02-14 MED ORDER — ONDANSETRON HCL (PF) 4 MG/2 ML INJECTION SOLUTION
4.0000 mg | INTRAMUSCULAR | Status: DC | PRN
Start: 2024-02-14 — End: 2024-02-15

## 2024-02-14 MED ORDER — FAMOTIDINE (PF) 20 MG/2 ML INTRAVENOUS SOLUTION
INTRAVENOUS | Status: AC
Start: 2024-02-14 — End: 2024-02-14
  Filled 2024-02-14: qty 2

## 2024-02-14 MED ORDER — GABAPENTIN 300 MG CAPSULE
300.0000 mg | ORAL_CAPSULE | Freq: Once | ORAL | Status: AC
Start: 2024-02-14 — End: 2024-02-14
  Administered 2024-02-14: 300 mg via ORAL

## 2024-02-14 MED ORDER — CELECOXIB 100 MG CAPSULE
200.0000 mg | ORAL_CAPSULE | Freq: Once | ORAL | Status: AC
Start: 2024-02-14 — End: 2024-02-14
  Administered 2024-02-14: 200 mg via ORAL

## 2024-02-14 SURGICAL SUPPLY — 60 items
ADH SKNCLS CYNCRLT SKINSTITCH LIQUID PREC APPL TIP NONST LF  DISP .5ML (SUTURE/WOUND CLOSURE) ×1 IMPLANT
BANDAGE ELT 5.8YDX4IN NONST SLFCLS ELAS KNIT TEAL END STCH VELCRO COTTON POLY BLND STD LGTH COMPRESS (WOUND CARE SUPPLY) ×1 IMPLANT
BANDAGE ELT 5.8YDX6IN NONST SLFCLS ELAS KNIT STRCH VELCRO COTTON POLY BLND STD LGTH COMPRESS BGE HNY (WOUND CARE SUPPLY) ×1 IMPLANT
BASEPLATE TIB TRIATH 4 KNEE TRIT (IMPLANTS KNEE) ×1 IMPLANT
BLADE 10 2 END CBNSTL SURG STRL DISP (SURGICAL CUTTING SUPPLIES) ×5 IMPLANT
BLADE SAW 90X25X1.27MM SGTL SY_S 6 (SURGICAL CUTTING SUPPLIES) ×1 IMPLANT
BLADE SAW STD MAKO STRL LF  DISP (SURGICAL CUTTING SUPPLIES) ×2 IMPLANT
CLEANER INSTRUMENT PRE-KLENZ 13.5 OZ (MISCELLANEOUS PT CARE ITEMS) ×1 IMPLANT
COUNTER 20 CNT BLOCK ADH NEEDLE STRL LF  RD SHARP FOAM 15.75X11.5X14IN DISP (MED SURG SUPPLIES) ×1 IMPLANT
COVER EQP 90X60IN HVDTY BCK PAD FNFLD BLU STRL (DRAPE/PACKS/SHEETS/OR TOWEL) ×3 IMPLANT
CUFF TOURNIQUET PURP 34X4IN COLOR CUF CYL 2 PORT 1 BLADDER QC 40IN STRL LF  DISP (MED SURG SUPPLIES) ×1 IMPLANT
DEVICE SUCT 2 FILTER CHAMBER SCKR STRL LF  DISP (MED SURG SUPPLIES) ×1 IMPLANT
DISCONTINUED USE 162189 - BANDAGE ELT 5.8YDX6IN NONST SLFCLS ELAS KNIT STRCH VELCRO COTTON POLY BLND STD LGTH COMPRESS BGE HNY (WOUND CARE SUPPLY) ×1 IMPLANT
DRAPE 2 INCS FILM ANTIMIC 33X23IN IOBN STRL SURG (DRAPE/PACKS/SHEETS/OR TOWEL) ×1 IMPLANT
DRAPE 76X55IN 3 QT HALYARD LF  STRL DISP SURG (DRAPE/PACKS/SHEETS/OR TOWEL) ×3 IMPLANT
DRAPE INCS ANTIMIC 23X23IN IOBN2 TRNSPR (DRAPE/PACKS/SHEETS/OR TOWEL) ×1 IMPLANT
DRESS 12X4IN PS MPLX BR FOAM ACUTE SURG WOUND (WOUND CARE SUPPLY) ×1 IMPLANT
DRESS 6X4IN AL-1 POSTOP MPLX BR FOAM DISP (WOUND CARE SUPPLY) ×1 IMPLANT
DRESS COMPRESS 13FTX6IN JNS COTTON RL HI ABS LF  STRL .5LB (WOUND CARE SUPPLY) ×1 IMPLANT
ELECTRODE PATIENT RTN 9FT VLAB C30- LB RM PHSV ACRL FOAM CORD NONIRRITATE NONSENSITIZE ADH STRP (SURGICAL CUTTING SUPPLIES) ×1 IMPLANT
FEM 4 PA CRCTE RTN BEAD TRIATH KNEE LFT COM STRL LF (IMPLANTS KNEE) ×1 IMPLANT
GLOVE SURG 6 LF  PF BEAD CUF STRL CRM 11.3IN PROTEXIS PLISPRN THK9.1 MIL (GLOVES AND ACCESSORIES) ×1 IMPLANT
GLOVE SURG 6.5 LF  PF BEAD CUF STRL CRM 11.3IN PROTEXIS PI PLISPRN THK9.1 MIL (GLOVES AND ACCESSORIES) ×1 IMPLANT
GLOVE SURG 6.5 LF  PF SMOOTH BEAD CUF INTLK STRL BLU 11.3IN PROTEXIS NEU-THERA PLISPRN THK7.9 MIL (GLOVES AND ACCESSORIES) ×1 IMPLANT
GLOVE SURG 7 LF  BEAD CUF DERMASHIELD PLISPRN (GLOVES AND ACCESSORIES) ×1 IMPLANT
GLOVE SURG 7.5 LTX PF SMOOTH BEAD CUF STRL YW 12IN PROTEXIS (GLOVES AND ACCESSORIES) ×1 IMPLANT
GLOVE SURG 8 LTX PF SMOOTH BEAD CUF STRL YW 12IN PROTEXIS NEU-THERA DDRGL THK8.7 MIL (GLOVES AND ACCESSORIES) ×1 IMPLANT
GOWN SURG XL STD LGTH L3 HKLP CLSR RGLN SLEEVE TWL STRL LF  DISP GRN AERO BLU PRFRM FBRC (DRAPE/PACKS/SHEETS/OR TOWEL) ×1 IMPLANT
GOWN SURGICAL SIRUS SMS LG XLONG 52IN LEV 4 STRL LF DISP BLUE (GLOVES AND ACCESSORIES) ×1 IMPLANT
HEMOSTAT ABS 14X2IN FLXB SHR W_V SRGCL STRL DISP (WOUND CARE SUPPLY) ×1 IMPLANT
INSERT TIB BRNG CRCTE RTN 4 9MM TRIATH X3 KNEE STRL LF (IMPLANTS KNEE) ×1 IMPLANT
KIT 1 PC PCKT DRP RIO RIO (DRAPE/PACKS/SHEETS/OR TOWEL) ×1 IMPLANT
LABEL MED CORRECT MED LABELING SYS 4 FLG 2 SHEET 24 PRPRNT STRL (MED SURG SUPPLIES) ×1 IMPLANT
MARKER SURG CKPNT KIT STRL LF  DISP (MED SURG SUPPLIES) ×1 IMPLANT
MATTRESS TRANSF 78X34IN AIRPAL C1000LB LONG STD 2 BELT 2 HOSE ATTACHMENT PNT SNAP BUTTON LBL DISP (MED SURG SUPPLIES) ×1 IMPLANT
NAVIGATE VIZADISC KNEE TRK KIT SYSTEM STRL LF  DISP (MED SURG SUPPLIES) ×1 IMPLANT
NEEDLE 1.5IN 18GA POLYPROP FIL LL HUB DEHP-FR STRL BLUNT BD REG WL LF  DISP RD (MED SURG SUPPLIES) ×1 IMPLANT
PATEL 9MM 29MM TRIT METAL ASYM TRIATH KNEE COM STRL (IMPLANTS KNEE) ×1 IMPLANT
PENCIL SMOKEVAC COAT PSHBTN LF (MED SURG SUPPLIES) ×1 IMPLANT
PIN FIX 110MM 4MM BONE STRL (IMPLANTS TRAUMA) IMPLANT
PIN FIX 140MM 4MM STR KNEE STRL (IMPLANTS TRAUMA) IMPLANT
PIN POSITION 110MM 4MM BONE STRL (IMPLANTS TRAUMA) IMPLANT
PIN POSITION 140MM 4MM STR KNEE STRL (IMPLANTS TRAUMA) IMPLANT
SEALER ESURG .226IN .137IN AQUAMANTYS 6 30D .236IN SPC BIPOLAR 2 ELECTRODE HMST 5.1IN STRL LF  DISP (SURGICAL CUTTING SUPPLIES) ×1 IMPLANT
SET INTPLS SUCT TUBE FAN SPRAY TIP HANDPC STRL LF  DISP (MED SURG SUPPLIES) ×1 IMPLANT
SOL IRRG 0.9% NACL 2000ML PRSV FR N-PYRG FLXB CONTAINR STRL LF (MEDICATIONS/SOLUTIONS) ×1 IMPLANT
SOL IV 0.9% NACL 1000ML STRL PRSV FR FLXB CONTAINR LF (MEDICATIONS/SOLUTIONS) ×1 IMPLANT
SOL SURG PREP 26ML DRPRP 74% ISPRP 0.7% IOD POVACRYLEX SLF CNTN APPL SKIN STRL PREOP (MED SURG SUPPLIES) ×1 IMPLANT
SPONGE LAP 18X18IN PREWASH RIGID TRY STRL LF  WHT (MED SURG SUPPLIES) ×2 IMPLANT
STAPLER SKIN 4.1X6.5MM 35 W STPL CART LF  APS U DISP CLR SS PLASTIC (WOUND CARE SUPPLY) ×1 IMPLANT
SUTURE 1 CT STRATAFIX PDS + 18IN VIOL ABS KNOTLESS TISS CONTROL SMTR ABS (SUTURE/WOUND CLOSURE) ×1 IMPLANT
SUTURE 1 CT VICRYL 36IN VIOL BRD COAT ABS (SUTURE/WOUND CLOSURE) ×4 IMPLANT
SUTURE 2-0 CT1 STRATAFIX PDS + 18IN VIOL ABS KNOTLESS TISS CONTROL SMTR ABS (SUTURE/WOUND CLOSURE) ×1 IMPLANT
SUTURE 2-0 CT1 TAPERPOINT VICRYL+ 27IN UNDYED BRD ANBCTRL COAT ABS (SUTURE/WOUND CLOSURE) ×2 IMPLANT
SUTURE 3-0 PS2 MONOCRYL MTPS 27IN UNDYED MONOF ABS (SUTURE/WOUND CLOSURE) ×1 IMPLANT
SUTURE 4-0 FS1 PROLENE 18IN BLU MONOF NONAB (SUTURE/WOUND CLOSURE) ×2 IMPLANT
SYRINGE LL 30ML LF  STRL CONCEN TIP GRAD N-PYRG DEHP-FR MED DISP (MED SURG SUPPLIES) ×1 IMPLANT
TOWEL 24X16IN COTTON BLU DISP SURG STRL LF (DRAPE/PACKS/SHEETS/OR TOWEL) ×4 IMPLANT
TRAY ~~LOC~~ ARTHROSCOPY ~~LOC~~ - ~~LOC~~ COMMUNITY HOSP (CUSTOM TRAYS & PACK) ×1 IMPLANT
WOUND IRRG IRRISEPT DBRD CLNSG 0.05% CHG SYSTEM STRL LF (WOUND CARE SUPPLY) ×1 IMPLANT

## 2024-02-14 NOTE — Anesthesia Transfer of Care (Signed)
 ANESTHESIA TRANSFER OF CARE   Adrienne Barton is a 83 y.o. ,female, Weight: 113 kg (250 lb)   had Procedure(s):  MAKO ROBOTIC ASSISTED LEFT TOTAL KNEE ARTHROPLASTY USING TRIATHLON IMPLANTS  performed  02/14/24   Primary Service: Nori Beat, DO    Past Medical History:   Diagnosis Date   . DJD (degenerative joint disease)    . Hairy cell leukemia    . History of basal cell cancer    . Hypertension    . Hypothyroidism    . Osteoarthritis       Allergy History as of 02/14/24       ADHESIVE         Noted Status Severity Type Reaction    02/13/24 0940 Dodie Frees, RN 05/06/16 Active Medium  Rash, Hives/ Urticaria    Comments: Both paper and adhesive tape cause blisters     03/27/21 1024 Earley Glee, Ambulatory Care Assistant 05/06/16 Active High  Rash    Comments: Both paper and adhesive tape cause blisters                   I completed my transfer of care / handoff to the receiving personnel during which we discussed:  Access, Airway, All key/critical aspects of case discussed, Analgesia, Antibiotics, Expectation of post procedure, Fluids/Product, Gave opportunity for questions and acknowledgement of understanding, Labs and PMHx      Post Location: PACU                                                           Last OR Temp: Temperature: 36.5 C (97.7 F)  ABG:  POTASSIUM   Date Value Ref Range Status   02/10/2024 4.3 3.5 - 5.1 mmol/L Final     KETONES   Date Value Ref Range Status   02/10/2024 Negative Negative, Trace mg/dL Final     CALCIUM   Date Value Ref Range Status   02/10/2024 9.5 8.6 - 10.3 mg/dL Final     Calculated P Axis   Date Value Ref Range Status   11/21/2023 58 degrees Final     Calculated R Axis   Date Value Ref Range Status   11/21/2023 64 degrees Final     Calculated T Axis   Date Value Ref Range Status   11/21/2023 90 degrees Final     Airway:* No LDAs found *  Blood pressure (!) 141/79, pulse 74, temperature 36.5 C (97.7 F), resp. rate 16, height 1.6 m (5' 3), weight 113 kg (250 lb),  SpO2 100%.

## 2024-02-14 NOTE — Anesthesia Procedure Notes (Signed)
 [facility]    Neuraxial Block    Performed by:   Performing Provider: Shawnie Delton, DO   Authorizing provider: Shawnie Delton, DO      Sedation        Blocks   Block: spinal   Type of block: single shot   Indication:primary anesthetic   Diagnosis: knee pain      Technique:  Pt location: In OR  Approach: midline        Preprocedure hand washing was performed sterile field maintained   Needle level: L2-3  Skin Prep: chlorhexidine and Antiseptic wash, Aseptic technique, Cap, Draped, Hand hygiene performed, Large sterile sheet, Mask, Sterile drape, Sterile field established, Sterile gloves, Sterile technique and Sterilely prepped and draped   Preanesthesia Checklist:  Pre anesthesia checklist: site marked, surgical consent, monitors and equipment checked, timeout performed, 02/14/2024 11:40 AM, anesthesia consent, emergency drugs available and positioning concerns  Position:  Positioning: sitting   Skin Local:  Skin local: Lidocaine 1%   Spinal Needle:  Spinal needle:Pencan    Spinal Needle gauge: 27 G     Spinal needle attempts: 1.  Epidural Needle:                Epidural Catheter:              Block Events:  Procedure Events:CSF   Test dose:                Medications    bupivacaine PF 0.75%-dextrose  8.25% (MARCAINE SPINAL) spinal injection - Intrathecal   1.6 mL - 02/14/2024 11:43:00 AM  Dosing      Patient response comfortable.    NOTES

## 2024-02-14 NOTE — Nurses Notes (Signed)
 Pulled Hydromorphone after receiving patient from PAC U. Pain scale was 10/10/. Pulled med and the waste was witnessed.  Once scanned at bedside it said it was too early to give.  Tried to return but omnicell pop up on screen read    Unable to return this item because only 0.2 mg is undocumented   Cannot admin med at this time. Cannot return med at this time. Called pharmacy (Spoke to [facility])  and was advised to tube it to them and Myrtie Atkinson would look into it tomorrow.

## 2024-02-14 NOTE — Nurses Notes (Signed)
 Physicial Therapy at bedisde to exercise patient.

## 2024-02-14 NOTE — H&P (Signed)
 Paper H and P on chart, will be scanned to EMR.

## 2024-02-14 NOTE — Anesthesia Preprocedure Evaluation (Signed)
 ANESTHESIA PRE-OP EVALUATION  Planned Procedure: MAKO ROBOTIC ASSISTED LEFT TOTAL KNEE ARTHROPLASTY USING TRIATHLON IMPLANTS (Left: Knee)  Review of Systems                   Pulmonary     Cardiovascular    Hypertension ,No peripheral edema,        GI/Hepatic/Renal           Endo/Other    hypothyroidism and osteoarthritis,      Neuro/Psych/MS        Cancer                  Physical Assessment      Airway       Mallampati: II    TM distance: >3 FB    Neck ROM: full  Mouth Opening: good.            Dental           (+) partials           Pulmonary    Breath sounds clear to auscultation  (-) no rhonchi, no decreased breath sounds, no wheezes, no rales and no stridor     Cardiovascular    Rhythm: regular  Rate: Normal  (-) no friction rub, carotid bruit is not present, no peripheral edema and no murmur     Other findings          Plan  ASA 3     Planned anesthesia type: spinal           PONV Plan:  I plan to administer pharmcologic prophalaxis antiemetics              Intravenous induction     Anesthesia issues/risks discussed are: Dental Injuries, Post-op Pain Management, Aspiration, Sore Throat, PONV, Nerve Injuries, High Neuraxial Block, Spinal Headache, Local Anesthetic Systemic Toxicity and Failure of Block.  Anesthetic plan and risks discussed with patient  signed consent obtained          Patient's NPO status is appropriate for Anesthesia.

## 2024-02-14 NOTE — Respiratory Therapy (Signed)
 Patient unavailable busy with nursing

## 2024-02-14 NOTE — PT Evaluation (Signed)
 .. [facility]  [address]  [place], [address]  [phone]  (Fax) [phone]  Rehabilitation Services  Physical Therapy Inpatient TKA Initial Evaluation    Patient Name: Adrienne Barton  Date of Birth: [birth year]  Height: Height: 160 cm (5' 3)  Weight: Weight: 113 kg (250 lb)  Room/Bed: 374/A  Payor: AETNA-MEDI-ADV / Plan: AET[employer] MEDICARE ADVANTAGE PPO / Product Type: PPO /       PMH:  Past Medical History:   Diagnosis Date    DJD (degenerative joint disease)     Hairy cell leukemia     History of basal cell cancer     Hypertension     Hypothyroidism     Osteoarthritis            Assessment:      (P) Pt is S/P left TKR, postop day zero, completes bedlevel and standing transfers at [facility] to MinA from Recovery bed. Amb with step-to gait and 3-point pattern + FWW. No lightheadedness or other issues voiced. Follow for left TKR rehab, group PT then needs assist to set up Outpatient PT. Pt states that family will come in to assist her.    Discharge Needs:    Equipment Recommendation: (P) front wheeled walker, bedside commode      The patient presents with mobility limitations due to impaired balance, impaired range of motion, and postop pain that significantly impair/prevent patient's ability to participate in mobility-related activities of daily living (MRADLs) including  ambulation and transfers in order to safely complete, toileting, bathing, food preparation, laundering/household tasks, safely entering/exiting the home, in reasonable time. This functional mobility deficit can be sufficiently resolved with the use of a (P) front wheeled walker, bedside commode  in order to decrease the risk of falls, morbidity, and mortality in performance of these MRADLs.  Patient is able to safely use this assistive device.    Discharge Disposition: (P) home with assist, home with outpatient services    JUSTIFICATION OF DISCHARGE RECOMMENDATION   Based on current diagnosis, functional  performance prior to admission, and current functional performance, this patient requires continued PT services in (P) home with assist, home with outpatient services in order to achieve significant functional improvements in these deficit areas: (P) anthropometric characteristics, gait, locomotion, and balance, muscle performance, posture, ROM (range of motion).        Plan:   Current Intervention: (P) balance training, bed mobility training, gait training, home exercise program, patient/family education, postural re-education, ROM (range of motion), strengthening, transfer training  To provide physical therapy services (P) other (see comments) (1-3x/day, Mon-Sat until DC)  for duration of (P) until discharge.    The risks/benefits of therapy have been discussed with the patient/caregiver and he/she is in agreement with the established plan of care.       Subjective & Objective     Past Medical History:   Diagnosis Date    DJD (degenerative joint disease)     Hairy cell leukemia     History of basal cell cancer     Hypertension     Hypothyroidism     Osteoarthritis             Past Surgical History:   Procedure Laterality Date    ESOPHAGUS SURGERY      GALLBLADDER SURGERY      HX HERNIA REPAIR      KNEE SURGERY Right     TONSILLECTOMY  02/14/24 1653   Rehab Session   Document Type evaluation   PT Visit Date 02/14/24   General Information   Patient Profile Reviewed yes   Pertinent History of Current Functional Problem Pt is an 82-YO female s/p left total knee replacement. Hx of right TKR about 10 years ago.   Medical Lines PIV Line   Respiratory Status room air   Existing Precautions/Restrictions fall precautions   Mutuality/Individual Preferences   Anxieties, Fears or Concerns pain   Individualized Care Needs amb with assist x1 + FWW   Patient-Specific Goals (Include Timeframe) DC in the AM after group PT when criteria are met   Plan of Care Reviewed With patient   Living Environment   Lives With  alone   Home Accessibility grab bars present (bathtub);bed and bath on same level   Functional Level Prior   Ambulation 1 - assistive equipment  (RW)   Transferring 1 - assistive equipment   Toileting 0 - independent   Bathing 1 - assistive equipment   Dressing 0 - independent   Eating 0 - independent   Communication 0 - understands/communicates without difficulty   Prior Functional Level Comment Pt is modif indep with use of FWW, indep with ADLs, drives but gets her groceries delivered to doorstep.   Pre Treatment Status   Pre Treatment Patient Status Patient supine in bed;Nurse approved session  (PACU b ed)   Support Present Pre Treatment  Nurse present  Brian Campanile)   Communication Pre Treatment  Nurse   Communication Pre Treatment Comment clear   Cognitive Assessment/Interventions   Behavior/Mood Observations alert;behavior appropriate to situation, WNL/WFL;cooperative   Orientation Status oriented x 4   Attention WNL/WFL   Follows Commands [facility]   Pre- Treatment Vital Signs   Pre-Treatment Heart Rate (beats/min) 87   Pre SpO2 (%) 93   O2 Delivery Pre Treatment room air   Pre-Treatment Pain   Pretreatment Pain Rating 10/10   Pre/Posttreatment Pain Comment just received pain meds   RUE Assessment   RUE Assessment WFL- Within Functional Limits   LUE Assessment   LUE Assessment WFL- Within Functional Limits   RLE Assessment   RLE Assessment WFL- Within Functional Limits   LLE Assessment   LLE Assessment X-Exceptions   LLE ROM Knee  (modif Jones dressing)   Trunk Assessment   Trunk Assessment WFL-Within Functional Limits   Mobility Assessment/Training   Additional Documentation Bed Mobility Assessment/Treatment (Group);Transfer Assessment/Treatment (Group);Gait Assessment/Treatment (Group)   Bed Mobility   Scoot/Bridge Independence stand-by assistance   Supine-Sit Independence minimum assist (75% patient effort)   Sit to Supine, Independence minimum assist (75% patient effort)   Bed Mobility, Assistive Device bed rails;Head  of Bed Elevated   Impairments flexibility decreased;pain   Safety Issues decreased use of legs for bridging/pushing   Transfer Assessment/Treatment   Sit-Stand Independence contact guard assist   Stand-Sit Independence stand-by assistance   Sit-Stand-Sit, Assist Device walker, front wheeled   Bed-Chair Independence stand-by assistance   Bed-Chair-Bed Assist Device walker, front wheeled   Maintains Weight-bearing Status (Transfers) able to maintain   Transfer Safety Issues balance decreased during turns;step length decreased;weight-shifting ability decreased   Transfer Impairments balance impaired;flexibility decreased;pain;ROM decreased;strength decreased   Gait Assessment/Treatment   Total Distance Ambulated 100   Independence  stand-by assistance   Assistive Device  walker, front wheeled   Gait Speed slow to fair   Deviations  cadence decreased;double stance time increased;increased trunk sway;lateral shift;limb motion velocity decreased;step length decreased;swing-to-stance ratio decreased  Maintain Weight Bearing Status able to maintain   Safety Issues  balance decreased during turns;step length decreased;weight-shifting ability decreased   Impairments  balance impaired;flexibility decreased;pain   Comment step-to gait pattern and 3-point pattern   Motor Skills/Interventions   Additional Documentation Balance Skills Training (Group)   Balance   Sitting Balance: Static good balance   Sitting, Dynamic (Balance) fair + balance   Sit-to-Stand Balance fair - balance   Standing Balance: Static fair balance   Standing Balance: Dynamic fair balance   Post Treatment Status   Post Treatment Patient Status Patient supine in bed;Call light within reach  (PACU bed)   Support Present Post Treatment  Nurse present   Communication Post Treatement Nurse   Communication Post Treatment Comment updates   Patient Effort good   Post-Treatment Vital Signs   Post-treatment Heart Rate (beats/min) 89   Post SpO2 (%) 96   O2 Delivery  Post Treatment room air   Post-Treatment Pain   Posttreatment Pain Rating 10/10   Physical Therapy Clinical Impression   Assessment Pt is S/P left TKR, postop day zero, completes bedlevel and standing transfers at [facility] to MinA from Recovery bed. Amb with step-to gait and 3-point pattern + FWW. No lightheadedness or other issues voiced. Follow for left TKR rehab, group PT then needs assist to set up Outpatient PT. Pt states that family will come in to assist her.   Criteria for Skilled Therapeutic yes;meets criteria;skilled treatment is necessary   Impairments Found (describe specific impairments) anthropometric characteristics;gait, locomotion, and balance;muscle performance;posture;ROM (range of motion)   Functional Limitations in Following  self-care;home management;community/leisure   Rehab Potential good   Therapy Frequency other (see comments)  (1-3x/day, Mon-Sat until DC)   Predicted Duration of Therapy Intervention (days/wks) until discharge   Anticipated Equipment Needs at Discharge (PT) front wheeled walker;bedside commode   Anticipated Discharge Disposition home with assist;home with outpatient services   Evaluation Complexity Justification   Patient History: Co-morbidity/factors that impact Plan of Care Obesity: causing adverse impact on function;Surgical procedure: causing pain &/or impaired function;One or more other medical co-morbidity;Home accessibility barriers/factors   Examination Components Range of motion;Strength;Balance;Bed mobility;Transfers;Ambulation   Presentation Stable: Uncomplicated, straight-forward, problem focused   Clinical Decision Making Low complexity   Evaluation Complexity Low complexity   Planned Therapy Interventions, PT Eval   Planned Therapy Interventions (PT) balance training;bed mobility training;gait training;home exercise program;patient/family education;postural re-education;ROM (range of motion);strengthening;transfer training   Physical Therapy Time and Intention    Total PT Minutes: 19       TOTAL KNEE ARTHROPLASTY (TKA) PHYSICAL THERAPY GOALS    1) AMBULATE 300 FEET WITH WALKER AND SUPERVISION.   2) TRANSFER BED TO CHAIR WITH SUPERVISION.   3) INCREASED STRENGTH TO 3+/5 KNEE EXTENSION.   4) INCREASED KNEE AAROM TO 4 TOP 90 DEGREES.  5) NEGOTIATES TRAINING STEPS USING HANDRAIL AND SUPERVISION.       Physical Therapy Inpatient TKA D/C Criteria        PATIENT/CAREGIVER EDUCATION  Educated on exercise program? NO  Can successfully demonstrate exercise form? NO  Can demonstrate safe/effective assistance with functional mobility? YES     IMPAIRMENT BASED CRITERIA  Does the patient demonstrate knee ROM of at least 8-75 degrees on the involved leg? COMMENT NA  Does the patient demonstrate a minimum of 3+/5 quadricep strength? NO  Does the patient demonstrate the ability to maintain any weight bearing precautions? YES  Does the patient demonstrated sensation of all LE dermatomes? YES  Does the  patient have adequate pain control of <4/10 for functional mobility at home? NO    FUNCTIONAL BASED CRITERIA  Does that patient demonstrate the ability to ambulate 80 feet with RW using CGA/SPV assistance? YES  Does the patient demonstrate the ability to perform bed mobility with no more than min assist? YES  Does the patient demonstrated the ability to sit to stand from the bed or chair with min assist? YES  Does the patient demonstrate the ability to walk up/down 3-5 stairs with min assist as required for home entry? COMMENT NA    ROM  Extension NA  Flexion NA        INTERVENTION MINUTES: EVALUATION 19 minutes    EVALUATION COMPLEXITY : CLINICAL DECISION MAKING OF LOW COMPLEXITY AS INDICATED BY PMH, PHYSICAL THERAPY ASSESSMENT OF MUSCULOSKELETAL AND NEUROLOGICAL SYSTEMS AND ACTIVITY LIMITATIONS. CLINICAL PRESENTATION IS STABLE AND UNCOMPLICATED    Therapist:     Barnie Sopko, PT  02/14/2024, 17:44

## 2024-02-14 NOTE — OR Nursing (Signed)
 Pre Op Leg Measurements:  Left Leg:  6 above patella - 25.5 inches; 6 below patella - 18.25 inches.

## 2024-02-14 NOTE — OR Surgeon (Signed)
 [place] [facility]  Operative Note     PATIENT NAME:  Adrienne Barton, Adrienne Barton  MRN:  [id number]  DOB:  [birth year]    Date of Procedure:  02/14/2024  Preoperative Diagnosis: OSTEOARTHRITIS LEFT KNEE   Postoperative Diagnoses:  OSTEOARTHRITIS LEFT KNEE   Procedure Performed: ProcProcedure(s) (LRB):  MAKO ROBOTIC ASSISTED LEFT TOTAL KNEE ARTHROPLASTY USING TRIATHLON IMPLANTS (Left)   Implants:  Implant Name Type Inv. Item Serial No. Manufacturer Lot No. LRB No. Used Action   FEM 4 PA CRCTE RTN BEAD TRIATH KNEE LFT COM STRL LF - [id number]  FEM 4 PA CRCTE RTN BEAD TRIATH KNEE LFT COM STRL LF  [employer] [id number] Left 1 Implanted   INSERT TIB BRNG CRCTE RTN 4 TRIATH X3 KNEE STRL LF - [id number]  INSERT TIB BRNG CRCTE RTN 4 TRIATH X3 KNEE STRL LF  [employer] [id number] Left 1 Implanted   BASEPLATE TIB TRIATH 4 KNEE TRIT - [id number]  BASEPLATE TIB TRIATH 4 KNEE TRIT  [employer] [id number] Left 1 Implanted   PATEL  TRIT METAL ASYM TRIATH KNEE COM STRL - [id number]  PATEL  TRIT METAL ASYM TRIATH KNEE COM STRL  [employer] [id number] Left 1 Implanted      [employer]    Surgeon: Nori Beat, DO   Anesthesia:   Spinal  Anesthesiologist: Shawnie Delton, DO  CRNA: Leitha Purl, CRNA  OR Staff: Circulator: Midge Albert, RN  PERIOPERATIVE CARE ASSISTANT: Janae Mclean., PCA; Jesusita Morris, PCA  Relief Circulator: Tami Falcon, RN  Scrub Person: Alana Allan, RN  Scrub First Assist: Dorise Garret, CST   First Assist: Dorise Garret  FA Function:Second deep layer closure  Anticoagulation: ASA   Estimated Blood Loss: * No blood loss amount entered *   Specimens: * No specimens in log *   Complications: None   Indications For Procedure:  Adrienne Barton  is a very pleasant  83 y.o. female  presenting  for OSTEOARTHRITIS LEFT KNEE   End stage arthritis interfering with ADL and failing multiple non-surgical treatments.  Discussed robotic technology,  thromoboembolism, infection and risks, benefits, indications, and complications of procedure. Informed signed consent obtained.    DESCRIPTION OF THE PROCEDURE:  Patient identified and time out completed with entire surgical team.  Antibiotics and tranexamic acid  administered prior to inflation of torniquet.  Satisfactory anesthesia obtained. Tourniquet was placed over padding on the thigh.   The leg was prepped with DuraPrep and draped in a sterile manner.  Elevation exsanguination, tourniquet to 350 mmHg.    Anterior approach to the knee through medial parapatellar arthrotomy, the knee exposed.  We placed the femoral array inside the incision.  Tibial array outside. The femoral and tibial markers were placed and we successfully registered the knee. The existing components were removed and robotic plan for bone cuts and balancing completed in flexion and extension.  The robot was then utilized to perform all cuts.  Posterior osteophytes were removed from the femur.   Patella was resected at the reflection of the patellar ligament.   Trials were positioned with excellent extension and flexion with good rotation, alignment and stability verified robotically. The patellar component was implanted and the component was verified to be well fixed.  Patellar tracking was evaluated and found to be satisfactory. Knee was copiously irrigated with pulsatile lavage, then one-minute soak with Irrisept solution.  The tibial baseplate impacted into position.  The knee then taken through range of motion with full  extension and flexion with good rotation, alignment and stability, balance and ROM again verified with MAKO robotics.    Wound was irrigated.  The arthrotomy was closed in interrupted fashion.  The wound was closed in layers finishing with subcuticular stitch on the skin.  Sterile bandage was applied, and the tourniquet was released.  The patient was awoken from anesthesia and taken to recovery room under the supervision  of anesthesia.  Nori Beat, DO

## 2024-02-14 NOTE — Interval H&P Note (Signed)
 H & P updated the day of the procedure.  1.  H&P completed within 30 days of surgical procedure and has been reviewed within 24 hours of admission but prior to surgery or a procedure requiring anesthesia services, the patient has been examined, and no change has occured in the patients condition since the H&P was completed.       Change in medications: No        No LMP recorded.      Comments:     2.  Patient continues to be appropriate candidate for planned surgical procedure. YES    Quenton Fetter, DO

## 2024-02-14 NOTE — Anesthesia Postprocedure Evaluation (Signed)
 Anesthesia Post Op Evaluation    Patient: Adrienne Barton  Procedure(s):  MAKO ROBOTIC ASSISTED LEFT TOTAL KNEE ARTHROPLASTY USING TRIATHLON IMPLANTS    Last Vitals:Temperature: 36.5 C (97.7 F) (02/14/24 1334)  Heart Rate: 74 (02/14/24 1334)  BP (Non-Invasive): (!) 141/79 (02/14/24 1334)  Respiratory Rate: 16 (02/14/24 1334)  SpO2: 100 % (02/14/24 1334)    No notable events documented.    Patient is sufficiently recovered from the effects of anesthesia to participate in the evaluation and has returned to their pre-procedure level.  Patient location during evaluation: PACU       Patient participation: complete - patient participated  Level of consciousness: awake and alert and responsive to verbal stimuli    Pain management: adequate  Airway patency: patent    Anesthetic complications: no  Cardiovascular status: acceptable  Respiratory status: acceptable  Hydration status: acceptable  Patient post-procedure temperature: Pt Normothermic   PONV Status: Absent

## 2024-02-14 NOTE — Nurses Notes (Signed)
 Bedside hand off given to: Ami  Vitals Signs as follows: Temp: 97.31f                                        HR: 87                                        Resp: 16                                        BP: 143/56                                        O2: 94 RA  Dressing Status:dressing is c/d/i. One personal belongings bag present.

## 2024-02-15 LAB — BASIC METABOLIC PANEL
ANION GAP: 6 mmol/L (ref 4–13)
BUN/CREA RATIO: 36 — ABNORMAL HIGH (ref 6–22)
BUN: 27 mg/dL — ABNORMAL HIGH (ref 7–25)
CALCIUM: 8.5 mg/dL — ABNORMAL LOW (ref 8.6–10.3)
CHLORIDE: 105 mmol/L (ref 98–107)
CO2 TOTAL: 28 mmol/L (ref 21–31)
CREATININE: 0.75 mg/dL (ref 0.60–1.30)
ESTIMATED GFR: 80 mL/min/{1.73_m2} (ref >59)
GLUCOSE: 150 mg/dL — ABNORMAL HIGH (ref 74–109)
OSMOLALITY, CALCULATED: 286 mosm/kg (ref 270–290)
POTASSIUM: 4.5 mmol/L (ref 3.5–5.1)
SODIUM: 139 mmol/L (ref 136–145)

## 2024-02-15 LAB — CBC WITH DIFF
BASOPHIL #: 0 10*3/uL (ref 0.00–0.10)
BASOPHIL %: 0 % (ref 0–1)
EOSINOPHIL #: 0 10*3/uL (ref 0.00–0.50)
EOSINOPHIL %: 0 % — ABNORMAL LOW (ref 1–7)
HCT: 38.5 % (ref 31.2–41.9)
HGB: 12.7 g/dL (ref 10.9–14.3)
LYMPHOCYTE #: 1 10*3/uL — ABNORMAL LOW (ref 1.10–3.10)
LYMPHOCYTE %: 9 % — ABNORMAL LOW (ref 16–46)
MCH: 29.4 pg (ref 24.7–32.8)
MCHC: 33 g/dL (ref 32.3–35.6)
MCV: 89.1 fL (ref 75.5–95.3)
MONOCYTE #: 0.5 10*3/uL (ref 0.20–0.90)
MONOCYTE %: 5 % (ref 4–11)
MPV: 9 fL (ref 7.9–10.8)
NEUTROPHIL #: 9.4 10*3/uL — ABNORMAL HIGH (ref 1.90–8.20)
NEUTROPHIL %: 86 % — ABNORMAL HIGH (ref 43–77)
PLATELETS: 206 10*3/uL (ref 140–440)
RBC: 4.32 10*6/uL (ref 3.63–4.92)
RDW: 14.5 % (ref 12.3–17.7)
WBC: 11 10*3/uL (ref 3.8–11.8)

## 2024-02-15 MED ORDER — HYDROCODONE 7.5 MG-ACETAMINOPHEN 325 MG TABLET
1.0000 | ORAL_TABLET | ORAL | 0 refills | Status: AC | PRN
Start: 2024-02-15 — End: ?

## 2024-02-15 MED ORDER — ASPIRIN 81 MG CHEWABLE TABLET
81.0000 mg | CHEWABLE_TABLET | Freq: Two times a day (BID) | ORAL | 0 refills | Status: AC
Start: 2024-02-15 — End: 2024-03-16

## 2024-02-15 NOTE — PT Treatment (Signed)
 [facility]  [address]  [place], [address]  [phone]  (Fax) [phone]  Rehabilitation Department  Physical Therapy Daily Inpatient TKA Note    Date: 02/15/2024  Patient's Name: Adrienne Barton  Date of Birth: [birth year]  Height: Height: 160 cm (5' 3)  Weight: Weight: 113 kg (250 lb)      Plan: Will continue under current POC.         Subjective/Objective/Assessment:  Flowsheet    02/15/24 0805   Rehab Session   Document Type therapy progress note (daily note)   PT Visit Date 02/15/24   General Information   Patient Profile Reviewed yes   Medical Lines PIV Line   Respiratory Status room air   Existing Precautions/Restrictions fall precautions   Pre Treatment Status   Pre Treatment Patient Status Patient sitting in bedside chair or w/c   Support Present Pre Treatment  Nurse present   Communication Pre Treatment  Charge Nurse   Communication Pre Treatment Comment cleared   Pre-Treatment Pain   Pretreatment Pain Rating 6/10   Transfer Assessment/Treatment   Sit-Stand Independence contact guard assist   Stand-Sit Independence stand-by assistance   Sit-Stand-Sit, Assist Device walker, front wheeled   Transfer Impairments endurance;pain;strength decreased   Gait Assessment/Treatment   Total Distance Ambulated 340   Independence  stand-by assistance   Assistive Device  walker, front wheeled   Impairments  endurance;pain;strength decreased   Comment reciprocal gait, no lob   Stairs Assessment/Treatment   Number of Stairs 4   Handrail Location both sides   Independence Level contact guard assist   Balance   Sitting Balance: Static good balance   Sitting, Dynamic (Balance) fair + balance   Sit-to-Stand Balance fair + balance   Standing Balance: Static fair balance   Standing Balance: Dynamic fair balance   Therapeutic Exercise   Comment group time; 0900-1000 patient particpate with group therapy, did well with exs, aa needed with slr's and saq's , all questions answered   Post  Treatment Status   Post Treatment Patient Status Patient sitting in bedside chair or w/c   Communication Post Treatement Charge Nurse   Patient Effort good   Post-Treatment Pain   Posttreatment Pain Rating 7/10   Cognitive Assessment/Intervention   Behavior/Mood Observations behavior appropriate to situation, WNL/WFL   LLE Assessment   Left Knee Flexion 64   Left Knee Extensor Lag 5   Physical Therapy Time and Intention   Total PT Minutes: 69   Therapy Plan Review/Discharge Plan (PT)   Anticipated Discharge Disposition home with assist;home with outpatient services     Patient gaited with rw 320ft, nolob, did well with ex program for home, all questions answered          Physical Therapy Inpatient TKA D/C Criteria        PATIENT/CAREGIVER EDUCATION  Educated on exercise program? YES  Can successfully demonstrate exercise form? YES  Can demonstrate safe/effective assistance with functional mobility? YES     IMPAIRMENT BASED CRITERIA  Does the patient demonstrate knee ROM of at least 8-75 degrees on the involved leg? NO  Does the patient demonstrate a minimum of 3+/5 quadricep strength? NO  Does the patient demonstrate the ability to maintain any weight bearing precautions? YES  Does the patient demonstrated sensation of all LE dermatomes? YES  Does the patient have adequate pain control of <4/10 for functional mobility at home? YES and COMMENT with pain med    FUNCTIONAL BASED CRITERIA  Does that patient demonstrate the ability to ambulate 80 feet with RW using CGA/SPV assistance? YES  Does the patient demonstrate the ability to perform bed mobility with no more than min assist? YES  Does the patient demonstrated the ability to sit to stand from the bed or chair with min assist? YES  Does the patient demonstrate the ability to walk up/down 3-5 stairs with min assist as required for home entry? YES    ROM  Extension 5  Flexion 64      THERAPIST  Estefana Heinz, PTA  02/15/2024, 12:14       Intervention minutes: GROUP  THERAPEUTIC EXERCISE 60 MINUTES and GAIT TRAINING 9 MINUTES    THERAPIST  Estefana Heinz, PTA  02/15/2024, 12:14

## 2024-02-15 NOTE — Nurses Notes (Signed)
 Pt drsg removed with small amount of dark red drainage.pt ambulated total of 30 feet with staff using walker and gait belt.knee measuring 25/19/13.

## 2024-02-15 NOTE — Care Plan (Signed)
 Problem: Adult Inpatient Plan of Care  Goal: Plan of Care Review  Outcome: Ongoing (see interventions/notes)  Goal: Patient-Specific Goal (Individualized)  Outcome: Ongoing (see interventions/notes)  Goal: Absence of Hospital-Acquired Illness or Injury  Outcome: Ongoing (see interventions/notes)  Goal: Optimal Comfort and Wellbeing  Outcome: Ongoing (see interventions/notes)  Goal: Rounds/Family Conference  Outcome: Ongoing (see interventions/notes)     Problem: Wound  Goal: Optimal Coping  Outcome: Ongoing (see interventions/notes)  Goal: Optimal Functional Ability  Outcome: Ongoing (see interventions/notes)  Goal: Absence of Infection Signs and Symptoms  Outcome: Ongoing (see interventions/notes)  Goal: Improved Oral Intake  Outcome: Ongoing (see interventions/notes)  Goal: Optimal Pain Control and Function  Outcome: Ongoing (see interventions/notes)  Goal: Skin Health and Integrity  Outcome: Ongoing (see interventions/notes)  Goal: Optimal Wound Healing  Outcome: Ongoing (see interventions/notes)     Problem: Fall Injury Risk  Goal: Absence of Fall and Fall-Related Injury  Outcome: Ongoing (see interventions/notes)     Problem: Knee Arthroplasty  Goal: Optimal Coping  Outcome: Ongoing (see interventions/notes)  Goal: Absence of Bleeding  Outcome: Ongoing (see interventions/notes)  Goal: Effective Bowel Elimination  Outcome: Ongoing (see interventions/notes)  Goal: Fluid and Electrolyte Balance  Outcome: Ongoing (see interventions/notes)  Goal: Optimal Functional Ability  Outcome: Ongoing (see interventions/notes)  Goal: Absence of Infection Signs and Symptoms  Outcome: Ongoing (see interventions/notes)  Goal: Intact Neurovascular Status  Outcome: Ongoing (see interventions/notes)  Goal: Anesthesia/Sedation Recovery  Outcome: Ongoing (see interventions/notes)  Goal: Optimal Pain Control and Function  Outcome: Ongoing (see interventions/notes)  Goal: Nausea and Vomiting Relief  Outcome: Ongoing (see  interventions/notes)  Goal: Effective Urinary Elimination  Outcome: Ongoing (see interventions/notes)  Goal: Effective Oxygenation and Ventilation  Outcome: Ongoing (see interventions/notes)

## 2024-02-15 NOTE — Nurses Notes (Signed)
 Given discharge information, follow-up appointments, medication information, and a box of nozin with instructions. Verbalized understanding.  All questions and concerns addressed.

## 2024-02-15 NOTE — Care Plan (Signed)
 Problem: Adult Inpatient Plan of Care  Goal: Plan of Care Review  02/15/2024 0500 by Georgana Kilts, RN  Outcome: Ongoing (see interventions/notes)  02/15/2024 0458 by Georgana Kilts, RN  Outcome: Ongoing (see interventions/notes)  Goal: Patient-Specific Goal (Individualized)  02/15/2024 0500 by Georgana Kilts, RN  Outcome: Ongoing (see interventions/notes)  02/15/2024 0458 by Georgana Kilts, RN  Outcome: Ongoing (see interventions/notes)  Goal: Absence of Hospital-Acquired Illness or Injury  02/15/2024 0500 by Georgana Kilts, RN  Outcome: Ongoing (see interventions/notes)  02/15/2024 0458 by Georgana Kilts, RN  Outcome: Ongoing (see interventions/notes)  Goal: Optimal Comfort and Wellbeing  02/15/2024 0500 by Georgana Kilts, RN  Outcome: Ongoing (see interventions/notes)  02/15/2024 0458 by Georgana Kilts, RN  Outcome: Ongoing (see interventions/notes)  Goal: Rounds/Family Conference  02/15/2024 0500 by Georgana Kilts, RN  Outcome: Ongoing (see interventions/notes)  02/15/2024 0458 by Georgana Kilts, RN  Outcome: Ongoing (see interventions/notes)     Problem: Wound  Goal: Optimal Coping  02/15/2024 0500 by Georgana Kilts, RN  Outcome: Ongoing (see interventions/notes)  02/15/2024 0458 by Georgana Kilts, RN  Outcome: Ongoing (see interventions/notes)  Goal: Optimal Functional Ability  02/15/2024 0500 by Georgana Kilts, RN  Outcome: Ongoing (see interventions/notes)  02/15/2024 0458 by Georgana Kilts, RN  Outcome: Ongoing (see interventions/notes)  Goal: Absence of Infection Signs and Symptoms  02/15/2024 0500 by Georgana Kilts, RN  Outcome: Ongoing (see interventions/notes)  02/15/2024 0458 by Georgana Kilts, RN  Outcome: Ongoing (see interventions/notes)  Goal: Improved Oral Intake  02/15/2024 0500 by Georgana Kilts, RN  Outcome: Ongoing (see interventions/notes)  02/15/2024 0458 by Georgana Kilts, RN  Outcome: Ongoing (see interventions/notes)  Goal: Optimal Pain Control and Function  02/15/2024 0500 by Georgana Kilts, RN  Outcome: Ongoing (see interventions/notes)  02/15/2024 0458 by Georgana Kilts, RN  Outcome: Ongoing (see interventions/notes)  Goal: Skin Health and  Integrity  02/15/2024 0500 by Georgana Kilts, RN  Outcome: Ongoing (see interventions/notes)  02/15/2024 0458 by Georgana Kilts, RN  Outcome: Ongoing (see interventions/notes)  Goal: Optimal Wound Healing  02/15/2024 0500 by Georgana Kilts, RN  Outcome: Ongoing (see interventions/notes)  02/15/2024 0458 by Georgana Kilts, RN  Outcome: Ongoing (see interventions/notes)     Problem: Fall Injury Risk  Goal: Absence of Fall and Fall-Related Injury  02/15/2024 0500 by Georgana Kilts, RN  Outcome: Ongoing (see interventions/notes)  02/15/2024 0458 by Georgana Kilts, RN  Outcome: Ongoing (see interventions/notes)     Problem: Knee Arthroplasty  Goal: Optimal Coping  02/15/2024 0500 by Georgana Kilts, RN  Outcome: Ongoing (see interventions/notes)  02/15/2024 0458 by Georgana Kilts, RN  Outcome: Ongoing (see interventions/notes)  Goal: Absence of Bleeding  02/15/2024 0500 by Georgana Kilts, RN  Outcome: Ongoing (see interventions/notes)  02/15/2024 0458 by Georgana Kilts, RN  Outcome: Ongoing (see interventions/notes)  Goal: Effective Bowel Elimination  02/15/2024 0500 by Georgana Kilts, RN  Outcome: Ongoing (see interventions/notes)  02/15/2024 0458 by Georgana Kilts, RN  Outcome: Ongoing (see interventions/notes)  Goal: Fluid and Electrolyte Balance  02/15/2024 0500 by Georgana Kilts, RN  Outcome: Ongoing (see interventions/notes)  02/15/2024 0458 by Georgana Kilts, RN  Outcome: Ongoing (see interventions/notes)  Goal: Optimal Functional Ability  02/15/2024 0500 by Georgana Kilts, RN  Outcome: Ongoing (see interventions/notes)  02/15/2024 0458 by Georgana Kilts, RN  Outcome: Ongoing (see interventions/notes)  Goal: Absence of Infection Signs and Symptoms  02/15/2024 0500 by Georgana Kilts, RN  Outcome: Ongoing (see interventions/notes)  02/15/2024 0458 by Georgana Kilts, RN  Outcome: Ongoing (see interventions/notes)  Goal: Intact Neurovascular Status  02/15/2024 0500 by Georgana Kilts, RN  Outcome: Ongoing (see interventions/notes)  02/15/2024 0458 by Georgana Kilts, RN  Outcome: Ongoing (see interventions/notes)  Goal: Anesthesia/Sedation Recovery  02/15/2024 0500 by Georgana Kilts,  RN  Outcome: Ongoing (see interventions/notes)  02/15/2024 0458 by Georgana Kilts, RN  Outcome: Ongoing (see interventions/notes)  Goal: Optimal Pain Control and Function  02/15/2024 0500 by Georgana Kilts, RN  Outcome: Ongoing (see interventions/notes)  02/15/2024 0458 by Georgana Kilts, RN  Outcome: Ongoing (see interventions/notes)  Goal: Nausea and Vomiting Relief  02/15/2024 0500 by Georgana Kilts, RN  Outcome: Ongoing (see interventions/notes)  02/15/2024 0458 by Georgana Kilts, RN  Outcome: Ongoing (see interventions/notes)  Goal: Effective Urinary Elimination  02/15/2024 0500 by Georgana Kilts, RN  Outcome: Ongoing (see interventions/notes)  02/15/2024 0458 by Georgana Kilts, RN  Outcome: Ongoing (see interventions/notes)  Goal: Effective Oxygenation and Ventilation  02/15/2024 0500 by Georgana Kilts, RN  Outcome: Ongoing (see interventions/notes)  02/15/2024 0458 by Georgana Kilts, RN  Outcome: Ongoing (see interventions/notes)

## 2024-02-15 NOTE — Discharge Summary (Signed)
 [facility]  DISCHARGE SUMMARY      PATIENT NAME:  Adrienne Barton, Adrienne Barton  MRN:  [id number]  DOB:  Jul 06, 1941    INPATIENT ADMISSION DATE: 02/14/2024  DISCHARGE DATE:  02/15/2024    ATTENDING PHYSICIAN: Nori Beat, DO  SERVICE: [facility] ORTH[facility]ED[facility]  PRIMARY CARE PHYSICIAN: Michaeleen Adler, FNP       Reason for Admission       Diagnosis    Osteoarthritis [phone]            DISCHARGE DIAGNOSIS:     Principal Problem:  Osteoarthritis    Active Hospital Problems    Diagnosis Date Noted    Principal Problem: Osteoarthritis [M19.90] 02/14/2024      Resolved Hospital Problems   No resolved problems to display.     There are no active non-hospital problems to display for this patient.     Allergies[1]       REASON FOR HOSPITALIZATION AND HOSPITAL COURSE:    This is a 83 y.o., female admitted for left total knee arthroplasty for full history and physical please see dictation.      DISCHARGE MEDICATIONS:     Current Discharge Medication List        START taking these medications.        Details   HYDROcodone-acetaminophen  7.5-325 mg Tablet  Commonly known as: NORCO   1 Tablet, Oral, EVERY 4 HOURS PRN  Qty: 42 Tablet  Refills: 0            CONTINUE these medications which have CHANGED during your visit.        Details   * aspirin 81 mg Tablet, Delayed Release (E.C.)  Commonly known as: ECOTRIN  What changed: Another medication with the same name was added. Make sure you understand how and when to take each.   81 mg, Daily  Refills: 0     * aspirin 81 mg Tablet, Chewable  What changed: You were already taking a medication with the same name, and this prescription was added. Make sure you understand how and when to take each.   81 mg, Oral, 2 TIMES DAILY  Qty: 60 Tablet  Refills: 0           * This list has 2 medication(s) that are the same as other medications prescribed for you. Read the directions carefully, and ask your doctor or other care provider to review them with you.                CONTINUE these medications -  NO CHANGES were made during your visit.        Details   buPROPion 150 mg tablet sustained-release 12 hr  Commonly known as: WELLBUTRIN SR   150 mg, 2 TIMES DAILY  Refills: 0     busPIRone 5 mg Tablet  Commonly known as: BUSPAR   5 mg, 2 TIMES DAILY PRN  Refills: 0     celecoxib  100 mg Capsule  Commonly known as: CeleBREX    100 mg, 2 TIMES DAILY  Refills: 0     DOCOSAHEXAENOIC ACID ORAL   1 g, 2 TIMES DAILY  Refills: 0     DULoxetine 60 mg Capsule, Delayed Release(E.C.)  Commonly known as: CYMBALTA DR   60 mg, Daily  Refills: 0     losartan 50 mg Tablet  Commonly known as: COZAAR   50 mg, Daily  Refills: 0     * Synthroid 200 mcg Tablet  Generic drug: levothyroxine  1 Tablet, EVERY MORNING  Refills: 0     * Synthroid 75 mcg Tablet  Generic drug: levothyroxine   1 Tablet, EVERY MORNING  Refills: 0           * This list has 2 medication(s) that are the same as other medications prescribed for you. Read the directions carefully, and ask your doctor or other care provider to review them with you.                STOP taking these medications.      diphenhydrAMINE  50 mg Capsule  Commonly known as: BENADRYL      famotidine  20 mg Tablet  Commonly known as: PEPCID             ASK your doctor about these medications.        Details   Ozempic 0.25 mg or 0.5 mg(2 mg/1.5 mL) Pen Injector  Generic drug: semaglutide   0.25 mg, EVERY 7 DAYS  Refills: 0              Ortho exam:  Filed Vitals:    02/14/24 1939 02/14/24 1955 02/14/24 2303 02/15/24 0420   BP: 132/76   115/83   Pulse: 87   69   Resp: 15 20 20 14    Temp: 36.8 C (98.2 F)   36.2 C (97.1 F)   SpO2: 90%   94%      Uneventful postoperative course.      Laboratory Data:     Results for orders placed or performed during the hospital encounter of 02/14/24 (from the past 24 hours)   BASIC METABOLIC PANEL, NON-FASTING   Result Value Ref Range    SODIUM 139 136 - 145 mmol/L    POTASSIUM 4.5 3.5 - 5.1 mmol/L    CHLORIDE 105 98 - 107 mmol/L    CO2 TOTAL 28 21 - 31 mmol/L    ANION  GAP 6 4 - 13 mmol/L    CALCIUM 8.5 (L) 8.6 - 10.3 mg/dL    GLUCOSE 409 (H) 74 - 109 mg/dL    BUN 27 (H) 7 - 25 mg/dL    CREATININE 8.11 9.14 - 1.30 mg/dL    BUN/CREA RATIO 36 (H) 6 - 22    ESTIMATED GFR 80 >59 mL/min/1.84m^2    OSMOLALITY, CALCULATED 286 270 - 290 mOsm/kg       Imaging Studies:    No orders to display       DISCHARGE INSTRUCTIONS:   Follow-up Information       Nori Beat, DO Follow up on 02/27/2024.    Specialty: ORTHOPAEDIC SURGERY  Why: Follow up with Pattie Borders on June 23rd @830am . Please arrive 15 mins early.  Please bring your photo ID,insurance cards,medication lists,and copays.  Should you have any questions please call Dr. India Mandes office.  Contact information:  [address]  [place] [address]  [phone]               Michaeleen Adler, FNP Follow up in 1 week(s).    Specialty: FAMILY NURSE PRACTITIONER  Contact information:  [address]  [place] [address]  [phone]                              Refer to Home Health - EXTERNAL     PT EVALUATE AND TREAT- OUTPATIENT     Need to be addressed: EVALUATE AND TREAT    Reason: Post TKR protocol  DME - WALKER Front Wheeled    Please note - If patient is 300 lbs or greater please order bariatric or heavy duty items.     Patient has mobility limits that significantly impairs ability to participate in one or more mobility related ADL's (MRADL's): Yes    Moblity Limitations: Pt at heightened risk of injury r/t attempts to fulfill MRADL's & can safely use walker which resolves issue    Walker Type: Front Wheeled    Freedom of Choice: I have informed patient of their freedom of choice with respect to DME providers      DME - BEDSIDE COMMODE    A standard commode is covered when the patient is physically incapable of utilizing regular toilet facilities.  An extra wide/heavy duty commode chair is covered for a patient who weighs 300 pounds or more.     I certify that a bedside commode is necessary due to Patient is confined to one  level of home and there is no toilet on that level    Bedside Commode Type Standard Bedside Commode Extra wide for pt>BMI 40   Freedom of Choice: I have informed patient of their freedom of choice with respect to DME providers             Advance Directive Information      Flowsheet Row Most Recent Value   Does the Patient have an Advance Directive? No, Information Offered and Given            CONDITION ON DISCHARGE: Stable    DISCHARGE DISPOSITION:  Home discharge     Nori Beat, DO    Copies sent to Care Team         Relationship Specialty Notifications Start End    Michaeleen Adler, FNP PCP - General FAMILY NURSE PRACTITIONER  02/04/23     Phone: [phone] Fax: [phone]         [address] [place] [address]              Referring providers can utilize [email or web address] to access their referred [facility] patient's information.                      [1]   Allergies  Allergen Reactions    Adhesive Rash and Hives/ Urticaria     Both paper and adhesive tape cause blisters

## 2024-02-15 NOTE — Care Management Notes (Signed)
 [facility]  Care Management Initial Evaluation    Patient Name: Adrienne Barton  Date of Birth: 05/11/1941  Sex: female  Date/Time of Admission: 02/14/2024  8:04 AM  Room/Bed: 374/A  Payor: AETNA-MEDI-ADV / Plan: AETNA MEDICARE ADVANTAGE PPO / Product Type: PPO /   Primary Care Providers:  Michaeleen Adler, FNP, FNP (General)    Pharmacy Info:   Preferred Pharmacy       [facility] [address] [place] - [address]    [address] [place] [address]    Phone: [phone] Fax: [phone]    Hours: Not open 24 hours          Emergency Contact Info:   Extended Emergency Contact Information  Primary Emergency Contact: COX,JILL  Mobile Phone: [phone]  Relation: Daughter  Preferred language: English  Interpreter needed? No    History:   Mechele Kittleson is a 83 y.o., female, admitted 02/14/24.    Height/Weight: 160 cm (5' 3) / 113 kg (250 lb)     LOS: 0 days   Admitting Diagnosis: Osteoarthritis [M19.90]    Assessment:      02/15/24 1300   Assessment Details   Assessment Type Admission   Date of Care Management Update 02/15/24   Readmission   Is this a readmission? No   Insurance Information/Type   Insurance type Medicare   Employment/Financial   Patient has Prescription Coverage?  Yes        Name of Insurance Coverage for Medications Aetna Medicare   Financial/Environmental Concerns none   Living Environment   Select an age group to open lives with row.  Adult   Lives With alone   Living Arrangements house   Able to Return to Prior Arrangements yes   IEP and/or 504 Plan? No   Home Safety   Home Assessment: No Problems Identified   Home Accessibility stairs to enter home   Custody and Legal Status   Do you have a court appointed guardian/conservator? No   Are you an emancipated minor? No   Custody Issues? No   Paternity Affidavit Requested? No   Care Management Plan   Discharge Planning Status initial meeting   Projected Discharge Date 02/15/24   Discharge plan discussed with: Patient    CM will evaluate for rehabilitation potential yes   Patient choice offered to patient/family Yes   Facility or Agency Preferences [facility]   Discharge Needs Assessment   Outpatient/Agency/Support Group Needs other (see comments)  (Outpatient Physical Therapy)   Equipment Currently Used at Home commode;walker, front wheeled   Equipment Needed After Discharge none   Discharge Facility/Level of Care Needs Home (Patient/Family Member/other)(code 1)   Transportation Available car;family or friend will provide   Discharge Information   Discharge Disposition home or self-care   Referral Information   Admission Type observation   Arrived From home or self-care   Home Main Entrance   Number of Stairs, Main Entrance one     Initial CM assessment completed. Pt admitted with total left knee arthroplasty. CM met with pt at bedside. Pt was alert and oriented to all spheres. Pt lives alone and plans to return there upon discharge. Pt has 1 step to get into their home and then it is all one level. Pt will have her daughters to assist in her care. Pt has a walker and an elevated commode. Pt would like to have outpatient physical therapy at [facility]. CM faxed referral to [facility] and pt is scheduled for 02/16/24 at 12:00 PM.     Discharge Plan:  Home (Patient/Family Member/other) (code 1)      The patient will continue to be evaluated for developing discharge needs.     Case Manager: Nestora Baptise, [place]  Phone: [phone]

## 2024-02-15 NOTE — Discharge Instructions (Signed)
 Orthopaedic Discharge Instructions:    Weightbear as tolerated to the operative extremity with use of an assistive device as needed upon ambulation    Maintain surgical dressing until postoperative day 7.  May remove dressing at this time and leave incision open to air if dry.  If there is persistent drainage please cover with a gauze dressing and change daily. Please call the clinic with any erythema or purulent drainage concerning for infection.    May shower, no tub baths until follow-up     Take pain medication and blood thinners as prescribed. Attend physical therapy as instructed.    Follow-up in the orthopedics clinic at your scheduled appointment in approximately 2-3 weeks.  Please call the office to confirm your appointment.  Please also call with any questions or concerns.      [facility]  [address] [place], [place] [address]  [phone]

## 2024-02-16 ENCOUNTER — Encounter (HOSPITAL_COMMUNITY): Payer: Self-pay

## 2024-02-16 ENCOUNTER — Other Ambulatory Visit: Payer: Self-pay

## 2024-02-16 ENCOUNTER — Ambulatory Visit (HOSPITAL_COMMUNITY)
Admission: RE | Admit: 2024-02-16 | Discharge: 2024-02-16 | Disposition: A | Discharge status: Still In Hospital | Payer: Self-pay | Source: Ambulatory Visit | Attending: Orthopaedic Surgery | Admitting: Orthopaedic Surgery

## 2024-02-16 DIAGNOSIS — Z96652 Presence of left artificial knee joint: Secondary | ICD-10-CM | POA: Insufficient documentation

## 2024-02-16 NOTE — PT Evaluation (Signed)
 [facility] [place] [facility]  Outpatient Physical Therapy  [address]  [place], [address]  [phone]  (Fax) [phone]      Physical Therapy Lower Extremity Evaluation    Date: 02/16/2024  Patient's Name: Adrienne Barton  Date of Birth: 07-26-1941  Physical Therapy Evaluation     Evaluating Physical Therapist: Jeanmarie Millet , PT, DPT  PT diagnosis/Reason for Referral: Left Total Knee Arthroplasty  Next Scheduled Physician Appointment: June 2025  Allergies/Contraindications: NA           SUBJECTIVE  Date of onset: June 10th, 2025 date of surgery    Mechanism of injury: Post operative    Current Presentation: Min Assist for ADLs since sx    Medications for this problem: pain medication and anti-inflammatory    Past Medical History:     Past Medical History:   Diagnosis Date    DJD (degenerative joint disease)     Hairy cell leukemia     History of basal cell cancer     Hypertension     Hypothyroidism     Osteoarthritis      Past Surgical History:   Past Surgical History:   Procedure Laterality Date    ESOPHAGUS SURGERY      GALLBLADDER SURGERY      HX HERNIA REPAIR      KNEE SURGERY Right     TONSILLECTOMY           Patient goals: REDUCE PAIN and NORMALIZE FUNCTION    Pain location: Medial compartment of left knee                     Pain description: ACHING    Pain frequency:  CONTINUOUS    Pain rating: Now 8/10   Worst 10/10    Radiculopathy: Denies    Pain increases with: ADLs, ACTIVITY, EXERCISE, STAND, and STAIR CLIMBING           decreases with : COLD and REST    Subjective Functional Reports:    Sitting: WFL    Standing: LIMITED    Walking: LIMITED    Lifting: LIMITED    Patient-Specific Functional Score:    Problem Score   1. Ambulation without AD  0   2. Getting in/out of car 2   Total 1.5   Total score = sum of the activity scores/number of activities    Minimal detectable change (90% CI) for avg score = 2 points    Minimal detectable change (90% CI) for single activity score  = 3 points             OBJECTIVE    Knee AROM   right left   Knee Flexion 95  64    Knee Extension 0 degrees  3 degrees from neutral        Strength (Manual Muscle Testing per [employer]all Muscle Grading system)     right left   Hip flexion  4 3   Hip extension  4 NT   Knee flexion  4 2   Knee extension  4 2   Ankle DF  5 4   Ankle PF  5 4       Gait: USES ASSISTIVE DEVICE, RECIPROCATING STEPS WITH ASYMMETRIC STRIDE LENGTH, INITIATION OF GAIT WITH HESITATION, and STIFF LEGGED GAIT ON LEFT        Five Times Sit to Stand Test: 49s from standard chair with BUE for push-off     >12 seconds =  falls for community dwelling adults    60-69 year olds 11.4 seconds    70-79 year olds 12.6 seconds    80-89 year olds 14.8 seconds      Timed Up and Go Test (TUG): 27s from standard chair with use of FWW    >13.5 seconds = falls for community dwelling adults    60-69 year olds (7.1-9.0 seconds)    70-79 year olds (8.2-10.2 seconds)    80-99 year olds (10.0-12.7 seconds)    Treatment provided:REVIEW OF POC AND GOALS WITH PATIENT, ALL QUESTIONS ANSWERED, PATIENT EDUCATION, and THERAPEUTIC EXERCISE     HEP/Access Code: Access Code: [email or web address]  URL: [email or web address]  Date: 02/16/2024  Prepared by: Tin Engram    Exercises  - Standing March with Counter Support  - 2 x daily - 5 x weekly - 2 sets  - Heel Raises with Counter Support  - 2 x daily - 5 x weekly - 2 sets - 10 reps  - Mini Squat with Counter Support  - 2 x daily - 5 x weekly - 2 sets - 10 reps    EXERCISE/ACTIVITY NAME REPETITIONS RESISTANCE COMPLETED THIS DOS   HEP guided practice and review:    -Standing march  -Heel raises  -Mini Squats       1 min  20  20    Yes           ASSESSMENT    Impression: Patient is an 83 year old female status post left total knee arthroplasty completed on 02/14/2024. Strength deficits present throughout left lower extremity greatest at the knee. Active range of motion restricted at left knee and provokes pain especially with movement into  flexion. Administered functional outcome measures to assess BLE strength (5TSTS) and dynamic balance (TUG) with patient scoring in fall risk category on both metrics. Patient will benefit from PT intervention to work on increasing functional strength and endurance as well as dynamic balance to improve ADL capacity and reduce risk of falls.     Rehab potential:       Short-Term Goals: 3 Weeks   -  Patient will demonstrate improved left knee AROM to at least 120 flexion and neutral extension to aid in ability to ambulate up and down stairs.     -  Patient will demonstrate independence with progressive HEP to maximize gains from physical therapy.     -  Patient will demonstrate improved strength of left LE WFL for supine SLR without extension lag to aid in functional transfers.     -  Patient will wean to least restrictive assistive device with minimal to no gait deviation to aid community ambulation.     Long-Term Goals: 6 Weeks     -  Patient will demonstrate improved left knee strength of at least 4/5 to aid in functional transfers.       -Improve score on TUG to at least  21.2s indicating improved dynamic balance and progress toward reduced fall risk. (27s on 6/12 ; MDC =2.9s)     -Improve score on 5TSTS to at least 40.6s  indicating improved BLE strength and progress toward reducing fall risk. (49s on 6/12 ; MDC =4.2s)    -  Patient will demonstrate improved functional ability with Patient Specific Scale Score of at least 3.5.       PLAN  Patient will attend 3 times per week x 6 weeks. Therapy may include, but is not limited to THERAPEUTIC EXERCISES, MYOFASCIAL/JOINT MOBILIZATION, POSTURE/BODY MECHANICS, ERGONOMIC TRAINING,  TRANSFER/GAIT TRAINING, HOME INSTRUCTIONS, HEAT/COLD, ULTRASOUND, ELECTRICAL STIMULATION, and KINESIOTAPE    Plan for next visit: Initiate PROM and end range stretching.        Evaluation complexity:   Personal factors impacting POC: PRE-EXISTING FUNCTIONAL LIMITATIONS   Co-morbidities  impacting POC: PREVIOUS SURGERIES  Complexity of physical exam: INCLUDING MUSCULOSKELETAL SYSTEM (POSTURE, ROM, STRENGTH, HEIGHT/WEIGHT), INCLUDING NEUROMUSCULAR EXAM (BALANCE, GAIT, LOCOMOTION, MOBILITY), and INCLUDING ACTIVITY/MOBILITY RESTRICTIONS   Clinical Presentation: STABLE   Evaluation Complexity: LOW-HISTORY 0, EXAMINATION 1-2, STABLE PRESENTATION      Total Session Time 40, Timed code minutes 15, and Untimed code minutes 30         Intervention minutes: EVALUATION 30 minutes and THERAPEUTIC EXERCISE 15 minutes    Isabellah Sobocinski, PT  02/16/2024 12:49

## 2024-02-20 ENCOUNTER — Other Ambulatory Visit: Payer: Self-pay

## 2024-02-20 ENCOUNTER — Ambulatory Visit (HOSPITAL_COMMUNITY)
Admission: RE | Admit: 2024-02-20 | Discharge: 2024-02-20 | Disposition: A | Discharge status: Still In Hospital | Payer: Self-pay | Source: Ambulatory Visit

## 2024-02-20 NOTE — PT Treatment (Deleted)
 [facility] Medicine [facility]  Outpatient Physical Therapy  [address]  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 Is pleased with how well she has done so far and notes that she is taking a lesser pain medicine with effective relief from the pain.     Objective: Warm up on Nustep followed by there ex per flow sheet for ROM and strengthening    Measured ROM: No ROM taken this visit  EXERCISE/ACTIVITY NAME REPETITIONS RESISTANCE COMPLETED THIS DOS   Nustep 5 minutes L1 Y   Seated heel slides 10  Y   Passive ROM EOB   Y   Quad sets 3 sec x 10  Y   SLR 10  Y   SAQ 3 sec x 10  Y   Step stretch flex/ext 10 sec x 5 each  Y   Incline board stretch 10 sec x 5  Y           DISCONTINUED ACTIVITIES                            Assessment: Pt most limited with knee flexion, needs verbal cueing during ambulation to flex L knee. Has satisfactory extension and decent quad strength for 1 week post op.    Short-Term Goals: 3 Weeks   -  Patient will demonstrate improved left knee AROM to at least 120 flexion and neutral extension to aid in ability to ambulate up and down stairs.                -  Patient will demonstrate independence with progressive HEP to maximize gains from physical therapy.                -  Patient will demonstrate improved strength of left LE WFL for supine SLR without extension lag to aid in functional transfers.                -  Patient will wean to least  restrictive assistive device with minimal to no gait deviation to aid community ambulation.     Long-Term Goals: 6 Weeks                -  Patient will demonstrate improved left knee strength of at least 4/5 to aid in functional transfers.                 -Improve score on TUG to at least  21.2s indicating improved dynamic balance and progress toward reduced fall risk. (27s on 6/12 ; MDC =2.9s)               -Improve score on 5TSTS to at least 40.6s  indicating improved BLE strength and progress toward reducing fall risk. (49s on 6/12 ; MDC =4.2s)               -  Patient will demonstrate improved functional ability with Patient Specific Scale Score of at least 3.5.      Plan: Continue and progress ROM and strength as appropriate per PT POC    Total Session Time 39 and Timed code minutes 39  THERAPEUTIC EXERCISE 39 minutes    Maisey Deandrade, PTA  02/20/2024, 10:22

## 2024-02-22 ENCOUNTER — Ambulatory Visit (HOSPITAL_COMMUNITY): Payer: Self-pay

## 2024-02-24 ENCOUNTER — Other Ambulatory Visit: Payer: Self-pay

## 2024-02-24 ENCOUNTER — Ambulatory Visit (HOSPITAL_COMMUNITY)
Admission: RE | Admit: 2024-02-24 | Discharge: 2024-02-24 | Disposition: A | Discharge status: Still In Hospital | Payer: Self-pay | Source: Ambulatory Visit

## 2024-02-24 NOTE — PT Treatment (Signed)
 [facility] Medicine [facility]  Outpatient Physical Therapy  [address]  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 She states she was more active yesterday and feels that has caused her to be more fatigued today and last night had some pain.     Objective: Warm up on Nustep followed by there ex per flow sheet for ROM and strengthening    Measured ROM: No ROM taken this visit  EXERCISE/ACTIVITY NAME REPETITIONS RESISTANCE COMPLETED THIS DOS   Nustep 10 minutes L1 Y   Seated heel slides 10  Y   Passive ROM EOB   n   Quad sets 3 sec x 10  n   SAQ 3x10  Y   Step stretch flex/ext 10sec x 3 each  Y   Incline board stretch 10 sec x 5  n   LAQ 3x10  y   SKC with OP x5  y     DISCONTINUED ACTIVITIES        SLR x10  Too easy                 Assessment: Does not want to push her flexion motion today stating it is sore from activity yesterday, her reps are also limited per her request. Slight redness at patella, patient instructed to monitor.     Short-Term Goals: 3 Weeks   -  Patient will demonstrate improved left knee AROM to at least 120 flexion and neutral extension to aid in ability to ambulate up and down stairs.                -  Patient will demonstrate independence with progressive HEP to maximize gains from physical therapy.                -  Patient will demonstrate improved strength of left LE WFL for supine SLR without extension lag to aid in functional transfers.                -   Patient will wean to least restrictive assistive device with minimal to no gait deviation to aid community ambulation.     Long-Term Goals: 6 Weeks                -  Patient will demonstrate improved left knee strength of at least 4/5 to aid in functional transfers.                 -Improve score on TUG to at least  21.2s indicating improved dynamic balance and progress toward reduced fall risk. (27s on 6/12 ; MDC =2.9s)               -Improve score on 5TSTS to at least 40.6s  indicating improved BLE strength and progress toward reducing fall risk. (49s on 6/12 ; MDC =4.2s)               -  Patient will  demonstrate improved functional ability with Patient Specific Scale Score of at least 3.5.      Plan:  Assess response to treatment. Inspect redness at patella.     Total Session Time 50 and Timed code minutes 40untimed min 10  THERAPEUTIC EXERCISE 40 minutes    Alfonso Ka, PTA  02/24/2024 17:57

## 2024-02-27 ENCOUNTER — Other Ambulatory Visit: Payer: Self-pay

## 2024-02-27 ENCOUNTER — Ambulatory Visit (HOSPITAL_COMMUNITY)
Admission: RE | Admit: 2024-02-27 | Discharge: 2024-02-27 | Disposition: A | Discharge status: Still In Hospital | Payer: Self-pay | Source: Ambulatory Visit

## 2024-02-27 DIAGNOSIS — Z96652 Presence of left artificial knee joint: Secondary | ICD-10-CM

## 2024-02-27 NOTE — PT Treatment (Signed)
 [facility] Medicine [facility]  Outpatient Physical Therapy  [address]  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 Also notes that she went to the Dr this morning and they were pleased with her progress so far.     Objective: Warm up on Nustep followed by there ex per flow sheet for ROM and strengthening    Measured ROM: 85 degrees flexion  EXERCISE/ACTIVITY NAME REPETITIONS RESISTANCE COMPLETED THIS DOS   Nustep 10 minutes L1 Y   Seated heel slides 10  Y   Passive ROM EOB   n   Quad sets 3 sec x 10  n   SAQ 3x10  Y   Step stretch flex/ext 10sec x 3 each  Y   Incline board stretch 10 sec x 5  n   LAQ 3x10  y   SKC with OP x5  y     DISCONTINUED ACTIVITIES        SLR x10  Too easy                 Assessment:  Still with a dusky red area around the patella however it does not look infected and she was at the Dr and they told her it was normal. She needs verbal cueing to slow exercises down and perform correctly.    Short-Term Goals: 3 Weeks   -  Patient will demonstrate improved left knee AROM to at least 120 flexion and neutral extension to aid in ability to ambulate up and down stairs.                -  Patient will demonstrate independence with progressive HEP to maximize gains from physical therapy.                -  Patient will demonstrate improved strength of left LE WFL for supine SLR without extension lag to aid in functional transfers.                 -  Patient will wean to least restrictive assistive device with minimal to no gait deviation to aid community ambulation.     Long-Term Goals: 6 Weeks                -  Patient will demonstrate improved left knee strength of at least 4/5 to aid in functional transfers.                 -Improve score on TUG to at least  21.2s indicating improved dynamic balance and progress toward reduced fall risk. (27s on 6/12 ; MDC =2.9s)               -Improve score on 5TSTS to at least 40.6s  indicating improved BLE strength and progress toward reducing fall risk. (49s on 6/12 ; MDC =4.2s)               -  Patient will demonstrate improved functional ability with Patient Specific Scale Score of at least 3.5.     Plan:  Assess response to treatment. Inspect redness at patella.     Total Session Time 40 and Timed code minutes 40  THERAPEUTIC EXERCISE 40 minutes    Glynis Hunsucker, PTA  02/27/2024 12:48

## 2024-02-27 NOTE — PT Treatment (Signed)
 [facility] Medicine [facility]  Outpatient Physical Therapy  [address]  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 Is pleased with how well she has done so far and notes that she is taking a lesser pain medicine with effective relief from the pain.      Objective: Warm up on Nustep followed by there ex per flow sheet for ROM and strengthening     Measured ROM: No ROM taken this visit  EXERCISE/ACTIVITY NAME REPETITIONS RESISTANCE COMPLETED THIS DOS   Nustep 5 minutes L1 Y   Seated heel slides 10   Y   Passive ROM EOB     Y   Quad sets 3 sec x 10   Y   SLR 10   Y   SAQ 3 sec x 10   Y   Step stretch flex/ext 10 sec x 5 each   Y   Incline board stretch 10 sec x 5   Y                DISCONTINUED ACTIVITIES                                             Assessment: Pt most limited with knee flexion, needs verbal cueing during ambulation to flex L knee. Has satisfactory extension and decent quad strength for 1 week post op.    Short-Term Goals: 3 Weeks   -  Patient will demonstrate improved left knee AROM to at least 120 flexion and neutral extension to aid in ability to ambulate up and down stairs.                -  Patient will demonstrate independence with progressive HEP to maximize gains from physical therapy.                -  Patient will demonstrate improved strength of left LE WFL for supine SLR without extension lag to aid in functional transfers.                -   Patient will wean to least restrictive assistive device with minimal to no gait deviation to aid community ambulation.     Long-Term Goals: 6 Weeks                -  Patient will demonstrate improved left knee strength of at least 4/5 to aid in functional transfers.                 -Improve score on TUG to at least  21.2s indicating improved dynamic balance and progress toward reduced fall risk. (27s on 6/12 ; MDC =2.9s)               -Improve score on 5TSTS to at  least 40.6s  indicating improved BLE strength and progress toward reducing fall risk. (49s on 6/12 ; MDC =4.2s)               -  Patient will demonstrate improved functional ability with Patient Specific Scale Score of at least 3.5.      Plan: Continue and progress ROM and strength as appropriate per PT POC     Total Session Time 39 and Timed code minutes 39  THERAPEUTIC EXERCISE 39 minutes     Sidhant Helderman, PTA  02/20/2024, 10:22

## 2024-02-29 ENCOUNTER — Ambulatory Visit
Admission: RE | Admit: 2024-02-29 | Discharge: 2024-02-29 | Disposition: A | Discharge status: Still In Hospital | Payer: Self-pay | Source: Ambulatory Visit

## 2024-02-29 ENCOUNTER — Other Ambulatory Visit: Payer: Self-pay

## 2024-02-29 NOTE — PT Treatment (Signed)
 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 Walking around her home with a cane but still using the walker out in to the community.     Objective: Warm up on Nustep followed by there ex per flow sheet for ROM and strengthening    Measured ROM: 85 degrees flexion  EXERCISE/ACTIVITY NAME REPETITIONS RESISTANCE COMPLETED THIS DOS   Nustep 10 minutes L2 Y   Seated heel slides 10  Y   Passive ROM EOB   n   Quad sets 3 sec x 10  n   SAQ 3x10  Y   Step stretch flex/ext 10sec x 3 each  Y   Incline board stretch 10 sec x 5  n   LAQ 3x10  y   SKC with OP x5  y     DISCONTINUED ACTIVITIES        SLR x10  Too easy                 Assessment: Pt is making good progress with ROM and strength. She was able to ambulate with SC with no LOB and little to no deviations.    Short-Term Goals: 3 Weeks   -  Patient will demonstrate improved left knee AROM to at least 120 flexion and neutral extension to aid in ability to ambulate up and down stairs.                -  Patient will demonstrate independence with progressive HEP to maximize gains from physical therapy.                -  Patient will demonstrate improved strength of left LE WFL for supine SLR without extension lag to aid in functional transfers.                -  Patient will wean to least restrictive assistive device with minimal to no gait deviation to aid community ambulation.      Long-Term Goals: 6 Weeks                -  Patient will demonstrate improved left knee strength of at least 4/5 to aid in functional transfers.                 -Improve score on TUG to at least  21.2s indicating improved dynamic balance and progress toward reduced fall risk. (27s on 6/12 ; MDC =2.9s)               -Improve score on 5TSTS to at least 40.6s  indicating improved BLE strength and progress toward reducing fall risk. (49s on 6/12 ; MDC =4.2s)               -  Patient will demonstrate improved functional ability with Patient Specific Scale Score of at least 3.5.  Plan:  Continue and progress as tolerated    Total Session Time 40 and Timed code minutes 40  THERAPEUTIC EXERCISE 40 minutes    Nirvan Laban, PTA  02/29/2024 10:55

## 2024-03-01 ENCOUNTER — Other Ambulatory Visit (HOSPITAL_COMMUNITY): Payer: Self-pay | Admitting: Orthopaedic Surgery

## 2024-03-01 DIAGNOSIS — Z96652 Presence of left artificial knee joint: Secondary | ICD-10-CM

## 2024-03-02 ENCOUNTER — Ambulatory Visit (HOSPITAL_COMMUNITY)
Admission: RE | Admit: 2024-03-02 | Discharge: 2024-03-02 | Disposition: A | Discharge status: Still In Hospital | Payer: Self-pay | Source: Ambulatory Visit

## 2024-03-02 ENCOUNTER — Other Ambulatory Visit: Payer: Self-pay

## 2024-03-02 NOTE — PT Treatment (Signed)
 [facility] Medicine [facility]  Outpatient Physical Therapy  [address]  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 She reports she did not sleep well last night as the knee is throbbing and reports pain upon arrival 5-/10. Notes she took pain meds at 1am and then prior to treatment.     Objective: Warm up on Nustep followed by there ex per flow sheet for ROM and strengthening    Measured ROM: supine AROM L knee: 0-97 degrees.   EXERCISE/ACTIVITY NAME REPETITIONS RESISTANCE COMPLETED THIS DOS   Nustep 10 minutes L2 Y   Seated heel slides 10  n   Passive ROM EOB  And supine y   Quad sets 3 sec x 10  n   SAQ 3x10  n   Step stretch flex/ext 10sec x 3 each  n   Incline board stretch 10 sec x 5  n   LAQ 3x10  n   SKC with OP x30 No OP y   // bars:  --fwd/back  --tall cone step over   X5 passes  Multiple passes    Y  y           DISCONTINUED ACTIVITIES        SLR x10  Too easy                 Assessment: Pt tends to amb with knee extended, worked in // bars with tall cone step overs to encourage gait with knee flexion. She requires constant VC for correct technique and to use mirror for visual feedback. She was instructed to repeat indep VC toes up, bend knee, bend hip to retrain brain for proper gait mechanics.     Short-Term Goals: 3 Weeks   -  Patient will demonstrate improved left knee AROM to at least 120 flexion and neutral extension to aid in ability to ambulate up and down  stairs.                -  Patient will demonstrate independence with progressive HEP to maximize gains from physical therapy.                -  Patient will demonstrate improved strength of left LE WFL for supine SLR without extension lag to aid in functional transfers.                -  Patient will wean to least restrictive assistive device with minimal to no gait deviation to aid community ambulation.     Long-Term Goals: 6 Weeks                -  Patient will demonstrate improved left knee strength of at least 4/5 to aid in functional transfers.                 -Improve score on TUG to at least  21.2s  indicating improved dynamic balance and progress toward reduced fall risk. (27s on 6/12 ; MDC =2.9s)               -Improve score on 5TSTS to at least 40.6s  indicating improved BLE strength and progress toward reducing fall risk. (49s on 6/12 ; MDC =4.2s)               -  Patient will demonstrate improved functional ability with Patient Specific Scale Score of at least 3.5.     Plan:  Assess gait mechanics and continue progressing ROM and strength of L knee.     Total Session Time 40 and Timed code minutes 40  THERAPEUTIC EXERCISE 40 minutes    Alfonso Ka, PTA  03/02/2024 09:54

## 2024-03-05 ENCOUNTER — Other Ambulatory Visit: Payer: Self-pay

## 2024-03-05 ENCOUNTER — Encounter (HOSPITAL_COMMUNITY): Payer: Self-pay

## 2024-03-05 ENCOUNTER — Ambulatory Visit (HOSPITAL_COMMUNITY)

## 2024-03-05 ENCOUNTER — Emergency Department (HOSPITAL_COMMUNITY)

## 2024-03-05 ENCOUNTER — Ambulatory Visit
Admission: RE | Admit: 2024-03-05 | Discharge: 2024-03-05 | Disposition: A | Discharge status: Still In Hospital | Payer: Self-pay | Source: Ambulatory Visit | Attending: Orthopaedic Surgery | Admitting: Orthopaedic Surgery

## 2024-03-05 ENCOUNTER — Emergency Department
Admission: EM | Admit: 2024-03-05 | Discharge: 2024-03-05 | Disposition: A | Discharge status: Home / Self Care | Source: Home / Self Care | Attending: Physician Assistant | Admitting: Physician Assistant

## 2024-03-05 DIAGNOSIS — M25562 Pain in left knee: Secondary | ICD-10-CM | POA: Insufficient documentation

## 2024-03-05 DIAGNOSIS — Z96652 Presence of left artificial knee joint: Secondary | ICD-10-CM | POA: Insufficient documentation

## 2024-03-05 DIAGNOSIS — M79652 Pain in left thigh: Secondary | ICD-10-CM | POA: Insufficient documentation

## 2024-03-05 DIAGNOSIS — M79605 Pain in left leg: Secondary | ICD-10-CM

## 2024-03-05 DIAGNOSIS — M1990 Unspecified osteoarthritis, unspecified site: Secondary | ICD-10-CM | POA: Insufficient documentation

## 2024-03-05 DIAGNOSIS — M79662 Pain in left lower leg: Secondary | ICD-10-CM | POA: Insufficient documentation

## 2024-03-05 DIAGNOSIS — R700 Elevated erythrocyte sedimentation rate: Secondary | ICD-10-CM | POA: Insufficient documentation

## 2024-03-05 DIAGNOSIS — Z471 Aftercare following joint replacement surgery: Secondary | ICD-10-CM

## 2024-03-05 LAB — CBC WITH DIFF
BASOPHIL #: 0.1 x10ˆ3/uL (ref 0.00–0.10)
BASOPHIL %: 1 % (ref 0–1)
EOSINOPHIL #: 0.2 x10ˆ3/uL (ref 0.00–0.50)
EOSINOPHIL %: 4 % (ref 1–7)
HCT: 36.8 % (ref 31.2–41.9)
HGB: 12.4 g/dL (ref 10.9–14.3)
LYMPHOCYTE #: 1.5 x10ˆ3/uL (ref 1.10–3.10)
LYMPHOCYTE %: 24 % (ref 16–46)
MCH: 29.9 pg (ref 24.7–32.8)
MCHC: 33.8 g/dL (ref 32.3–35.6)
MCV: 88.5 fL (ref 75.5–95.3)
MONOCYTE #: 0.5 x10ˆ3/uL (ref 0.20–0.90)
MONOCYTE %: 8 % (ref 4–11)
MPV: 8 fL (ref 7.9–10.8)
NEUTROPHIL #: 4 x10ˆ3/uL (ref 1.90–8.20)
NEUTROPHIL %: 63 % (ref 43–77)
PLATELETS: 309 x10ˆ3/uL (ref 140–440)
RBC: 4.16 x10ˆ6/uL (ref 3.63–4.92)
RDW: 14.6 % (ref 12.3–17.7)
WBC: 6.4 x10ˆ3/uL (ref 3.8–11.8)

## 2024-03-05 LAB — COMPREHENSIVE METABOLIC PANEL, NON-FASTING
ALBUMIN/GLOBULIN RATIO: 1.3 (ref 0.8–1.4)
ALBUMIN: 3.8 g/dL (ref 3.5–5.7)
ALKALINE PHOSPHATASE: 114 U/L — ABNORMAL HIGH (ref 34–104)
ALT (SGPT): 12 U/L (ref 7–52)
ANION GAP: 5 mmol/L (ref 4–13)
AST (SGOT): 16 U/L (ref 13–39)
BILIRUBIN TOTAL: 0.5 mg/dL (ref 0.3–1.0)
BUN/CREA RATIO: 28 — ABNORMAL HIGH (ref 6–22)
BUN: 22 mg/dL (ref 7–25)
CALCIUM, CORRECTED: 9.4 mg/dL (ref 8.9–10.8)
CALCIUM: 9.2 mg/dL (ref 8.6–10.3)
CHLORIDE: 106 mmol/L (ref 98–107)
CO2 TOTAL: 29 mmol/L (ref 21–31)
CREATININE: 0.8 mg/dL (ref 0.60–1.30)
ESTIMATED GFR: 74 mL/min/1.73mˆ2 (ref >59)
GLOBULIN: 2.9 (ref 2.0–3.5)
GLUCOSE: 93 mg/dL (ref 74–109)
OSMOLALITY, CALCULATED: 283 mosm/kg (ref 270–290)
POTASSIUM: 4.7 mmol/L (ref 3.5–5.1)
PROTEIN TOTAL: 6.7 g/dL (ref 6.4–8.9)
SODIUM: 140 mmol/L (ref 136–145)

## 2024-03-05 LAB — PT/INR
INR: 1.2 — ABNORMAL HIGH (ref 0.84–1.10)
PROTHROMBIN TIME: 13.5 s — ABNORMAL HIGH (ref 9.8–12.7)

## 2024-03-05 LAB — C-REACTIVE PROTEIN(CRP),INFLAMMATION: C-REACTIVE PROTEIN (CRP): <0.5 mg/dL (ref 0.1–0.5)

## 2024-03-05 LAB — GRAY TOP TUBE

## 2024-03-05 LAB — PTT (PARTIAL THROMBOPLASTIN TIME): APTT: 31.5 s (ref 25.0–38.0)

## 2024-03-05 LAB — SEDIMENTATION RATE: ERYTHROCYTE SEDIMENTATION RATE (ESR): 41 mm/h — ABNORMAL HIGH (ref <30)

## 2024-03-05 LAB — GOLD TOP TUBE

## 2024-03-05 MED ORDER — HYDROCODONE 7.5 MG-ACETAMINOPHEN 325 MG TABLET
ORAL_TABLET | ORAL | Status: AC
Start: 2024-03-05 — End: 2024-03-05
  Filled 2024-03-05: qty 1

## 2024-03-05 MED ORDER — HYDROCODONE 7.5 MG-ACETAMINOPHEN 325 MG TABLET
1.0000 | ORAL_TABLET | ORAL | Status: AC
Start: 2024-03-05 — End: 2024-03-05
  Administered 2024-03-05: 1 via ORAL

## 2024-03-05 NOTE — ED APP Handoff Note (Signed)
 [facility] - Emergency Department  Emergency Department  Provider in Triage Note    Name: Jamilex Bohnsack  Age: 83 y.o.  Gender: female     Subjective:   Lauriann Milillo is a 83 y.o. female who presents with complaint of Knee Pain  .      Objective:   Filed Vitals:    03/05/24 1139   BP: (!) 168/121   Pulse: (!) 118   Resp: (!) 22   Temp: 36.1 C (96.9 F)   SpO2: 96%      Focused Physical Exam shows left knee pain, post surgical 3 weeks surgery did by Dr. Joesph, redness and warmth to the touch    Assessment:  A medical screening exam was completed.  This patient is a 83 y.o. female with initial findings showing no respiratory or cardiovascular distress in triage, temperature 36.1, heart rate 118, respiratory rate 22, blood pressure 168/121    Plan:  Please see initial orders and work-up below.  This is to be continued with full evaluation in the main Emergency Department.     No current facility-administered medications for this encounter.     Results for orders placed or performed during the hospital encounter of 03/05/24 (from the past 24 hours)   CBC/DIFF    Collection Time: 03/05/24 11:41 AM    Narrative    The following orders were created for panel order CBC/DIFF.  Procedure                               Abnormality         Status                     ---------                               -----------         ------                     CBC WITH IPQQ[[id number]]                                                                 Please view results for these tests on the individual orders.        Jasper Ruminski, FNP  03/05/2024, 11:42

## 2024-03-05 NOTE — Discharge Instructions (Signed)
 Go straight to the [facility] for further evaluation and treatment.  Return to the ER for other emergencies or as needed

## 2024-03-05 NOTE — ED Triage Notes (Signed)
 Pt c/o left knee pain since yesterday. Pt reports she had a knee replacement 3 weeks ago and began experiencing significant pain yesterday. Pt denies injury.

## 2024-03-05 NOTE — ED Nurses Note (Signed)
 Patient to ED room 30 due to left knee pain. She says she had her knee replaced on 6/12 and has had no issues until Friday when she started having left knee pain that radiates to her left hip.

## 2024-03-05 NOTE — PT Treatment (Signed)
 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 States she can't go on with it like it is. Reports she is out of prescribed post-op pain medication. Tried to reach orthopedist office but was unable to connect. Patient adamant that she needs to hold PT treatment today and present to ER for work-up.     Objective: Observed patient's left knee- appreciated increased erythema at distal incision. No diffuse edema greater than previous session appreciated. Localized pain present but there is no calf pain when patient ambulates.      EXERCISE/ACTIVITY NAME REPETITIONS RESISTANCE COMPLETED THIS DOS   Nustep 10 minutes L2 N   Seated heel slides 10   n   Passive ROM EOB   And supine N   Quad sets 3 sec x 10   n   SAQ 3x10   n   Step stretch flex/ext 10sec x 3 each   n   Incline board stretch 10 sec x 5   n   LAQ 3x10   n   SKC with OP x30 No OP N   // bars:  --fwd/back  --tall cone step over    X5 passes  Multiple passes      N  N      DISCONTINUED ACTIVITIES            SLR x10   Too easy                          Assessment: Extensive discussion held with patient about signs of infection vs breakthrough pain from surgery. Advised patient to continue trying to connect with orthopedist for medical management of her pain. Patient with increased erythema of distal incision today but did not  demonstrate other signs of infection. Patient unable to complete full PT session today due to heightened pain. She requested holding exercise today so she can present to ER for work-up and pain management. Will follow and assess for appropriateness to treat next visit.      Short-Term Goals: 3 Weeks   -  Patient will demonstrate improved left knee AROM to at least 120 flexion and neutral extension to aid in ability to ambulate up and down stairs.                -  Patient will demonstrate independence with progressive HEP to maximize gains from physical therapy.                -  Patient will demonstrate improved strength of left LE WFL for supine SLR without extension lag to aid in functional transfers.                -  Patient will wean to least restrictive assistive device with minimal to no gait deviation to aid community ambulation.  Long-Term Goals: 6 Weeks                -  Patient will demonstrate improved left knee strength of at least 4/5 to aid in functional transfers.                 -Improve score on TUG to at least  21.2s indicating improved dynamic balance and progress toward reduced fall risk. (27s on 6/12 ; MDC =2.9s)               -Improve score on 5TSTS to at least 40.6s  indicating improved BLE strength and progress toward reducing fall risk. (49s on 6/12 ; MDC =4.2s)               -  Patient will demonstrate improved functional ability with Patient Specific Scale Score of at least 3.5.      Plan:   Assess for medical status change (conclusion of ER work up) and if patient able to continue.    Total Session Time 15 and Timed code minutes 15  THERAPEUTIC ACTIVITY 15 minutes      Captain Blucher, PT  03/05/2024, 11:20

## 2024-03-05 NOTE — ED Nurses Note (Signed)
 Patient D/C home at this time. Instructions reviewed and understanding verbalized. Discharged paperwork provided. Patient left department via ambulation with walker.

## 2024-03-05 NOTE — ED Provider Notes (Signed)
 Emergency Medicine      Name: Adrienne Barton  Age and Gender: 83 y.o. female  Date of Birth: Jul 13, 1941  MRN: [id number]  PCP: Adrienne Buerger, FNP    CC:  Chief Complaint   Patient presents with    Knee Pain       HPI:  Adrienne Barton is a 83 y.o. White female who presents to the ER with left knee/leg pain.  Patient states she started having left knee pain on Saturday which radiates both proximally and distally.  She says she had a knee replacement 3 weeks ago and was actually doing well postoperatively and during her therapy.  She says she awakened 2 days ago with the pain, denies any known injury.  She says the pain is present with weight-bearing and with rest.  Denies any definite aggravating or alleviating factors.    Below pertinent information reviewed with patient:  Past Medical History:   Diagnosis Date    DJD (degenerative joint disease)     Hairy cell leukemia     History of basal cell cancer     Hypertension     Hypothyroidism     Osteoarthritis            Allergies[1]    Past Surgical History:   Procedure Laterality Date    ESOPHAGUS SURGERY      GALLBLADDER SURGERY      HX HERNIA REPAIR      KNEE SURGERY Right     MAKO ROBOTIC ASSISTED LEFT TOTAL KNEE ARTHROPLASTY USING TRIATHLON IMPLANTS Left 02/14/2024    Performed by Joesph Kocher, DO at [facility]    TONSILLECTOMY          Social History     Socioeconomic History    Marital status: Widowed   Tobacco Use    Smoking status: Never    Smokeless tobacco: Never   Vaping Use    Vaping status: Never Used   Substance and Sexual Activity    Alcohol  use: Not Currently    Drug use: Never     Social Determinants of Health     Social Connections: Low Risk  (02/14/2024)    Social Connections     SDOH Social Isolation: 5 or more times a week       ROS:  No other overt positive review of systems are noted other than stated in the HPI.      Objective:    ED Triage Vitals [03/05/24 1139]   BP (Non-Invasive) (!) 168/121   Heart Rate (!) 118   Respiratory Rate (!) 22    Temperature 36.1 C (96.9 F)   SpO2 96 %   Weight 113 kg (250 lb)   Height 1.6 m (5' 3)     Filed Vitals:    03/05/24 1200 03/05/24 1415 03/05/24 1430 03/05/24 1445   BP: (!) 148/81 (!) 125/91 139/79 (!) 147/84   Pulse:       Resp:       Temp:    36.8 C (98.2 F)   SpO2:   95% (!) 87%       Nursing notes and vital signs reviewed.    Constitutional - No acute distress.  Alert and Active.  HEENT - Normocephalic. Conjunctiva clear. Moist mucous membranes.   Neck - Trachea midline. No stridor. No hoarseness.  Cardiac - Regular rate and rhythm. No murmurs, rubs, or gallops. Intact distal pulses.  Respiratory/Chest - Normal respiratory effort. Clear to auscultation bilaterally. No rales, wheezes  or rhonchi.   Musculoskeletal - Good AROM. Tenderness of the left knee medially and laterally. Tenderness along the lateral left thigh and calf. The knee is warm to touch. No clubbing, cyanosis.   Skin - Warm and dry, without any rashes or other lesions.  Neuro - Alert and oriented x 3. Moving all extremities symmetrically.   Psych - Normal mood and affect. Behavior is normal         Any pertinent labs and imaging obtained during this encounter reviewed below in MDM.    MDM/ED Course:      Medical Decision Making  Patient presents to the ER with left knee/leg pain.  Patient states she started having left knee pain on Saturday which radiates both proximally and distally.  She says she had a knee replacement 3 weeks ago and was actually doing well postoperatively and during her therapy.  She says she awakened 2 days ago with the pain, denies any known injury.  She says the pain is present with weight-bearing and with rest.  Denies any definite aggravating or alleviating factors.    Differential diagnoses include but are not limited to:  Postoperative pain, deep vein thrombosis, septic joint    Patient underwent diagnostics with results as noted in ED course.  Workup reveals no clear etiology the patient's pain.  Sed rate is 41  which is slightly elevated but other labs do not really indicate an infectious process.  X-rays unremarkable, there is no deep vein thrombosis.  Patient was treated with Norco in the ED. Dr. Vick office was contacted and they have made arrangements for the patient to be seen in the office this afternoon.  Case was discussed with Dr. Stevie who was in agreement.    Patient was found/suspected to have left knee pain status post total knee replacement    Patient will be discharged in stable condition at this time.    Amount and/or Complexity of Data Reviewed  Labs:  Decision-making details documented in ED Course.  Radiology:  Decision-making details documented in ED Course.  ECG/medicine tests: independent interpretation performed.    Risk  Prescription drug management.          ED Course as of 03/05/24 1640   Mon Mar 05, 2024   1228 XR KNEE LEFT 4 OR MORE VIEWS  UNREMARKABLE LEFT TOTAL KNEE ARTHROPLASTY   1423 PERIPHERAL VENOUS DUPLEX - LOWER  NO EVIDENCE OF DEEP VENOUS THROMBOSIS.   1452 CBC/DIFF  Unremarkable   1452 SEDIMENTATION RATE(!): 41  Slightly elevated   1452 INR(!): 1.20   1452 PROTHROMBIN TIME(!): 13.5   1452 aPTT: 31.5   1452 C-REACTIVE PROTEIN (CRP): <0.5   1452 COMPREHENSIVE METABOLIC PANEL, NON-FASTING(!)  No significant abnormalities       Orders Placed This Encounter    ADULT ROUTINE BLOOD CULTURE, SET OF 2 ADULT BOTTLES (BACTERIA AND YEAST)    ADULT ROUTINE BLOOD CULTURE, SET OF 2 ADULT BOTTLES (BACTERIA AND YEAST)    XR KNEE LEFT 4 OR MORE VIEWS    CBC/DIFF    C-REACTIVE PROTEIN(CRP),INFLAMMATION    COMPREHENSIVE METABOLIC PANEL, NON-FASTING    PT/INR    PTT (PARTIAL THROMBOPLASTIN TIME)    SEDIMENTATION RATE    CBC WITH DIFF    EXTRA TUBES    GOLD TOP TUBE    GRAY TOP TUBE    PERIPHERAL VENOUS DUPLEX - LOWER    HYDROcodone -acetaminophen  (NORCO) 7.5 mg-325 mg per tablet       Impression:  Clinical Impression   Left leg pain   Acute pain of left knee (Primary)   Status post total  left knee replacement       Disposition: Discharged    / Allean Holms PA-C  03/05/2024, 13:56  [facility], [place] Departments of Emergency Medicine  [place]    Portions of this note may have been dictated using voice recognition software.     -----------------------  Results for orders placed or performed during the hospital encounter of 03/05/24 (from the past 12 hours)   C-REACTIVE PROTEIN(CRP),INFLAMMATION   Result Value Ref Range    C-REACTIVE PROTEIN (CRP) <0.5 0.1 - 0.5 mg/dL   COMPREHENSIVE METABOLIC PANEL, NON-FASTING   Result Value Ref Range    SODIUM 140 136 - 145 mmol/L    POTASSIUM 4.7 3.5 - 5.1 mmol/L    CHLORIDE 106 98 - 107 mmol/L    CO2 TOTAL 29 21 - 31 mmol/L    ANION GAP 5 4 - 13 mmol/L    BUN 22 7 - 25 mg/dL    CREATININE 9.19 9.39 - 1.30 mg/dL    BUN/CREA RATIO 28 (H) 6 - 22    ESTIMATED GFR 74 >59 mL/min/1.10m^2    ALBUMIN 3.8 3.5 - 5.7 g/dL    CALCIUM 9.2 8.6 - 89.6 mg/dL    GLUCOSE 93 74 - 890 mg/dL    ALKALINE PHOSPHATASE 114 (H) 34 - 104 U/L    ALT (SGPT) 12 7 - 52 U/L    AST (SGOT) 16 13 - 39 U/L    BILIRUBIN TOTAL 0.5 0.3 - 1.0 mg/dL    PROTEIN TOTAL 6.7 6.4 - 8.9 g/dL    ALBUMIN/GLOBULIN RATIO 1.3 0.8 - 1.4    OSMOLALITY, CALCULATED 283 270 - 290 mOsm/kg    CALCIUM, CORRECTED 9.4 8.9 - 10.8 mg/dL    GLOBULIN 2.9 2.0 - 3.5   PT/INR   Result Value Ref Range    PROTHROMBIN TIME 13.5 (H) 9.8 - 12.7 seconds    INR 1.20 (H) 0.84 - 1.10   PTT (PARTIAL THROMBOPLASTIN TIME)   Result Value Ref Range    APTT 31.5 25.0 - 38.0 seconds   SEDIMENTATION RATE   Result Value Ref Range    ERYTHROCYTE SEDIMENTATION RATE (ESR) 41 (H) <30 mm/hr   CBC WITH DIFF   Result Value Ref Range    WBC 6.4 3.8 - 11.8 x10^3/uL    RBC 4.16 3.63 - 4.92 x10^6/uL    HGB 12.4 10.9 - 14.3 g/dL    HCT 63.1 68.7 - 58.0 %    MCV 88.5 75.5 - 95.3 fL    MCH 29.9 24.7 - 32.8 pg    MCHC 33.8 32.3 - 35.6 g/dL    RDW 85.3 87.6 - 82.2 %    PLATELETS 309 140 - 440 x10^3/uL    MPV 8.0 7.9 -  10.8 fL    NEUTROPHIL % 63 43 - 77 %    LYMPHOCYTE % 24 16 - 46 %    MONOCYTE % 8 4 - 11 %    EOSINOPHIL % 4 1 - 7 %    BASOPHIL % 1 0 - 1 %    NEUTROPHIL # 4.00 1.90 - 8.20 x10^3/uL    LYMPHOCYTE # 1.50 1.10 - 3.10 x10^3/uL    MONOCYTE # 0.50 0.20 - 0.90 x10^3/uL    EOSINOPHIL # 0.20 0.00 - 0.50 x10^3/uL    BASOPHIL # 0.10 0.00 - 0.10 x10^3/uL   GRAY  TOP TUBE   Result Value Ref Range    RAINBOW/EXTRA TUBE AUTO RESULT Yes      XR KNEE LEFT 4 OR MORE VIEWS   Final Result   UNREMARKABLE LEFT TOTAL KNEE ARTHROPLASTY                      Radiologist location ID: [id number]                  [1]   Allergies  Allergen Reactions    Adhesive Rash and Hives/ Urticaria     Both paper and adhesive tape cause blisters

## 2024-03-07 ENCOUNTER — Ambulatory Visit (HOSPITAL_COMMUNITY)
Admission: RE | Admit: 2024-03-07 | Discharge: 2024-03-07 | Disposition: A | Discharge status: Still In Hospital | Payer: Self-pay | Source: Ambulatory Visit

## 2024-03-07 ENCOUNTER — Other Ambulatory Visit: Payer: Self-pay

## 2024-03-07 DIAGNOSIS — Z96652 Presence of left artificial knee joint: Secondary | ICD-10-CM | POA: Insufficient documentation

## 2024-03-07 NOTE — PT Treatment (Signed)
 [facility] Medicine [facility]  Outpatient Physical Therapy  [address]  [place], [address]  [phone]  (Fax) [phone]    Physical Therapy Treatment Note    Date: 03/07/2024  Patient's Name: Adrienne Barton  Date of Birth: 1941/01/19  Physical Therapy Visit    Visit #/POC: 7 of 18  Authorization: [employer]  POC Signed?: NA  POC Ends: 03/29/24  Next Progress Note Due: 8-10th visit     Evaluating Physical Therapist: Jhonny Heman, PT, DPT   PT diagnosis/Reason for Referral: L TKA  Next Scheduled Physician Appointment:   Allergies/Contraindications: NA     Subjective:  Patient reports ED outcome: performed CT, blood test and x-rays with no findings, she was sent to surgeon office in which she seen PA in which she was placed on antibiotic. She states not using AD at home, uses walking stick when going out on porch.     Objective: treatment delivered as noted below.     Supine AROM L knee 03/07/24: 0-93 degrees.     EXERCISE/ACTIVITY NAME REPETITIONS RESISTANCE COMPLETED THIS DOS   Nustep 10 minutes L3 y   Seated heel slides 10   n   Passive ROM EOB   And supine y   [employer] 3 sec x 10   n   SAQ 3x10   n   Step stretch flex/ext 5 sec x 10each  flexion only y   Incline board stretch 10 sec x 5   n   LAQ 3x10   n   SKC with OP x30 No OP N   // bars:  --fwd/back  --tall cone step over    X5 passes  Multiple passes      N  N   Step tap to step platform x10  y      DISCONTINUED ACTIVITIES            SLR x10   Too easy                          Assessment: Redness is significantly improved. Good tolerance to session.    Short-Term Goals: 3 Weeks   -  Patient will demonstrate improved left knee AROM to at least 120 flexion and neutral extension to aid in ability to ambulate up and down stairs.                -  Patient will demonstrate independence with progressive HEP to maximize gains from physical therapy.                -  Patient will demonstrate improved strength of left LE WFL for supine SLR  without extension lag to aid in functional transfers.                -  Patient will wean to least restrictive assistive device with minimal to no gait deviation to aid community ambulation.     Long-Term Goals: 6 Weeks                -  Patient will demonstrate improved left knee strength of at least 4/5 to aid in functional transfers.                 -Improve score on TUG to at least  21.2s indicating improved dynamic balance and progress toward reduced fall risk. (27s on 6/12 ; MDC =2.9s)               -  Improve score on 5TSTS to at least 40.6s  indicating improved BLE strength and progress toward reducing fall risk. (49s on 6/12 ; MDC =4.2s)               -  Patient will demonstrate improved functional ability with Patient Specific Scale Score of at least 3.5.      Plan:   Assess for medical status change (conclusion of ER work up) and if patient able to continue.    Total Session Time 37 and Timed code minutes 37  Therex x37 min      Alfonso Ka, PTA  03/08/2024 18:46 (late entry for DOS 03/07/24)

## 2024-03-08 ENCOUNTER — Ambulatory Visit (HOSPITAL_COMMUNITY): Payer: Self-pay

## 2024-03-10 LAB — ADULT ROUTINE BLOOD CULTURE, SET OF 2 BOTTLES (BACTERIA AND YEAST)
BLOOD CULTURE, ROUTINE: NO GROWTH
BLOOD CULTURE, ROUTINE: NO GROWTH

## 2024-03-13 ENCOUNTER — Other Ambulatory Visit: Payer: Self-pay

## 2024-03-13 ENCOUNTER — Ambulatory Visit (HOSPITAL_COMMUNITY)
Admission: RE | Admit: 2024-03-13 | Discharge: 2024-03-13 | Disposition: A | Discharge status: Still In Hospital | Payer: Self-pay | Source: Ambulatory Visit

## 2024-03-13 NOTE — PT Treatment (Signed)
 [facility] Medicine [facility]  Outpatient Physical Therapy  [address]  [place], [address]  [phone]  (Fax) [phone]    Physical Therapy Treatment Note    Date: 03/13/2024  Patient's Name: Adrienne Barton  Date of Birth: [birth year]  Physical Therapy Visit    Visit #/POC: 8 of 18  Authorization: [employer]  POC Signed?: NA  POC Ends: 03/29/24  Next Progress Note Due: 8-10th visit     Evaluating Physical Therapist: Jhonny Heman, PT, DPT   PT diagnosis/Reason for Referral: L TKA  Next Scheduled Physician Appointment:   Allergies/Contraindications: NA     Subjective:  Patient states she is feeling well today. Reports ER told her the left knee incision was inflamed but not infected. Reports her pain level is 0/10 in the left knee today. Began ambulating with her walking stick a week ago and has felt confident with it. Denies any falls.      Objective:  Treatment as below:     EXERCISE/ACTIVITY NAME REPETITIONS RESISTANCE COMPLETED THIS DOS   Nustep 6 minutes L8 y   Seated heel slides 10   n   Passive ROM EOB    Y   Scar mobilization / cross friction scar massage   Y   Quad sets 3 sec x 10   n   SAQ 3x10   n   Dynamic Stretch:     -Flexion/extension at tolerable end range 45s x 2   Y   Incline board stretch 30s x 2    Y   LAQ 3x10   n   SKC with OP x30 No OP N   Long Hallway:  --fwd/back  --tall cone step over    50' x 2   Multiple passes     Y  N   Step tap to step platform x10   N   Lateral step overs 15 Orange cone Y      DISCONTINUED ACTIVITIES            SLR x10   Too easy                          Assessment: Patient's gait improved this session as she demonstrated reciprocal pattern with increased WB on the left LE during stance phase. Initiated scar mobilization today with good tolerance. Patient advised to begin completing scar massage at home with Vitamin E oil. Unable to complete lateral step-overs with 5 box but was able to clear cone. Performance degraded with fatigue.       Short-Term Goals: 3 Weeks   -  Patient will demonstrate improved left knee AROM to at least 120 flexion and neutral extension to aid in ability to ambulate up and down stairs.                -  Patient will demonstrate independence with progressive HEP to maximize gains from physical therapy.                -  Patient will demonstrate improved strength of left LE WFL for supine SLR without extension lag to aid in functional transfers.                -  Patient will wean to least restrictive assistive device with minimal to no gait deviation to aid community ambulation.     Long-Term Goals: 6 Weeks                -  Patient will demonstrate improved left knee strength of at least 4/5 to aid in functional transfers.                 -Improve score on TUG to at least  21.2s indicating improved dynamic balance and progress toward reduced fall risk. (27s on 6/12 ; MDC =2.9s)               -Improve score on 5TSTS to at least 40.6s  indicating improved BLE strength and progress toward reducing fall risk. (49s on 6/12 ; MDC =4.2s)               -  Patient will demonstrate improved functional ability with Patient Specific Scale Score of at least 3.5.      Plan:   Continue progressive exercise. Progress check next week.     Total Session Time 35 and Timed code minutes 35  THERAPEUTIC EXERCISE 35 minutes      Maycie Luera, PT  03/13/2024 09:49

## 2024-03-15 ENCOUNTER — Ambulatory Visit (HOSPITAL_COMMUNITY)
Admission: RE | Admit: 2024-03-15 | Discharge: 2024-03-15 | Disposition: A | Discharge status: Still In Hospital | Payer: Self-pay | Source: Ambulatory Visit

## 2024-03-15 ENCOUNTER — Other Ambulatory Visit: Payer: Self-pay

## 2024-03-15 NOTE — PT Treatment (Signed)
 [facility] Medicine [facility]  Outpatient Physical Therapy  [address]  [place], [address]  [phone]  (Fax) [phone]    Physical Therapy Treatment Note    Date: 03/15/2024  Patient's Name: Adrienne Barton  Date of Birth: 06-05-1941  Physical Therapy Visit    Visit #/POC: 9 of 18  Authorization: [employer]  POC Signed?: NA  POC Ends: 03/29/24  Next Progress Note Due: 8-10th visit     Evaluating Physical Therapist: Jhonny Heman, PT, DPT   PT diagnosis/Reason for Referral: L TKA  Next Scheduled Physician Appointment:   Allergies/Contraindications: NA     Subjective:  States the knee feels more stiff and sore than at visit earlier this week.      Objective:  Treatment as below:   supine AROM 95 degrees (after manual stretching in seated); supine AROM after prone Quad stretch 103 degrees.   EXERCISE/ACTIVITY NAME REPETITIONS RESISTANCE COMPLETED THIS DOS   Nustep 10 minutes L3 y   Seated heel slides 10   n   Passive ROM EOB    Y   Scar mobilization / cross friction scar massage   n   Quad sets 3 sec x 10   n   SAQ 3x10   n   Dynamic Stretch:     -Flexion/extension at tolerable end range 45s x 2   n   Incline board stretch 30s x 2    n   LAQ 3x10   n   SKC with OP x30 No OP N   Long Hallway:  --fwd/back  --tall cone step over    50' x 2   Multiple passes     n  N   Step tap to step platform x10   N   Lateral step overs 15 Orange cone n   Prone Quad stretch    Manual prone Quad stretch 3x30  Y: HEP 03/15/24  y   Stair stretch for flexion 5x15 sec  y      DISCONTINUED ACTIVITIES            SLR x10   Too easy                          Assessment: Patient avoids stretching flexion into end range to increase motion. Initiated prone Quad stretch and she was given gauze strap for home with instructions to focus on end range improvements as the knee is beginning to feel a little tight. She is self limiting her gains. Active flexion in supine increased after performing prone Quad stretching active with  strap and manual.    Short-Term Goals: 3 Weeks   -  Patient will demonstrate improved left knee AROM to at least 120 flexion and neutral extension to aid in ability to ambulate up and down stairs.                -  Patient will demonstrate independence with progressive HEP to maximize gains from physical therapy.                -  Patient will demonstrate improved strength of left LE WFL for supine SLR without extension lag to aid in functional transfers.                -  Patient will wean to least restrictive assistive device with minimal to no gait deviation to aid community ambulation.     Long-Term Goals: 6 Weeks                -  Patient will demonstrate improved left knee strength of at least 4/5 to aid in functional transfers.                 -Improve score on TUG to at least  21.2s indicating improved dynamic balance and progress toward reduced fall risk. (27s on 6/12 ; MDC =2.9s)               -Improve score on 5TSTS to at least 40.6s  indicating improved BLE strength and progress toward reducing fall risk. (49s on 6/12 ; MDC =4.2s)               -  Patient will demonstrate improved functional ability with Patient Specific Scale Score of at least 3.5.      Plan:  Will re-assess ROM upon her return as she will be OOT next week.     Total Session Time 37 and Timed code minutes 37  THERAPEUTIC EXERCISE 37 minutes      Alfonso Ka, PTA  03/15/2024 10:05

## 2024-03-26 ENCOUNTER — Ambulatory Visit (HOSPITAL_COMMUNITY): Payer: Self-pay

## 2024-03-26 NOTE — Progress Notes (Incomplete)
 [facility] Medicine [facility]  Outpatient Physical Therapy  [address]  [place], [address]  [phone]  (Fax) [phone]    Physical Therapy Progress Note    Date: 03/26/2024  Patient's Name: Adrienne Barton  Date of Birth: 11/12/1940  Physical Therapy Progress Note     Visit #/POC: 10 of 18  Authorization: [employer]  POC Signed?: NA  POC Ends: 03/29/24    Evaluating Physical Therapist: Jhonny Heman, PT, DPT   PT diagnosis/Reason for Referral: L TKA  Next Scheduled Physician Appointment:   Allergies/Contraindications: NA     Subjective:  States the knee feels more stiff and sore than at visit earlier this week.     Patient-Specific Functional Score:     Problem Score, evaluation Score, progress check   1. Ambulation without AD  0    2. Getting in/out of car 2    Total 1.5    Total score = sum of the activity scores/number of activities    Minimal detectable change (90% CI) for avg score = 2 points    Minimal detectable change (90% CI) for single activity score = 3 points                Objective:  Treatment as below:    Knee AROM (seated EOB)    Right, eval Left, eval Left, progress check   Knee Flexion 95  64     Knee Extension 0 degrees  3 degrees from neutral       Strength (Manual Muscle Testing per [employer] Muscle Grading system)       Left, evaluation Left, progress check   Hip flexion  3    Hip extension  NT    Knee flexion  2    Knee extension  2    Ankle DF  4    Ankle PF  4        Five Times Sit to Stand Test:     [49s from standard chair with BUE for push-off at eval]    >12 seconds = falls for community dwelling adults    60-69 year olds 11.4 seconds    70-79 year olds 12.6 seconds    80-89 year olds 14.8 seconds        Timed Up and Go Test (TUG):     [27s from standard chair with use of FWW at eval]    >13.5 seconds = falls for community dwelling adults    60-69 year olds (7.1-9.0 seconds)    70-79 year olds (8.2-10.2 seconds)    80-99 year olds (10.0-12.7  seconds)    EXERCISE/ACTIVITY NAME REPETITIONS RESISTANCE COMPLETED THIS DOS   Nustep 10 minutes L3 y   Seated heel slides 10   n   Passive ROM EOB     Y   Scar mobilization / cross friction scar massage     n   Quad sets 3 sec x 10   n   SAQ 3x10   n   Dynamic Stretch:      -Flexion/extension at tolerable end range 45s x 2    n   Incline board stretch 30s x 2    n   LAQ 3x10   n   SKC with OP x30 No OP N   Long Hallway:  --fwd/back  --tall cone step over    50' x 2   Multiple passes      n  N   Step tap to step  platform x10   N   Lateral step overs 15 Orange cone n   Prone Quad stretch     Manual prone Quad stretch 3x30   N: HEP 03/15/24  N   Stair stretch for flexion 5x15 sec   N      DISCONTINUED ACTIVITIES            SLR x10   Too easy                          Assessment: Patient avoids stretching flexion into end range to increase motion. Initiated prone Quad stretch and she was given gauze strap for home with instructions to focus on end range improvements as the knee is beginning to feel a little tight. She is self limiting her gains. Active flexion in supine increased after performing prone Quad stretching active with strap and manual.     Short-Term Goals: 3 Weeks   -  Patient will demonstrate improved left knee AROM to at least 120 flexion and neutral extension to aid in ability to ambulate up and down stairs.                -  Patient will demonstrate independence with progressive HEP to maximize gains from physical therapy.                -  Patient will demonstrate improved strength of left LE WFL for supine SLR without extension lag to aid in functional transfers.                -  Patient will wean to least restrictive assistive device with minimal to no gait deviation to aid community ambulation.     Long-Term Goals: 6 Weeks                -  Patient will demonstrate improved left knee strength of at least 4/5 to aid in functional transfers.                 -Improve score on TUG to at least  21.2s  indicating improved dynamic balance and progress toward reduced fall risk. (27s on 6/12 ; MDC =2.9s)               -Improve score on 5TSTS to at least 40.6s  indicating improved BLE strength and progress toward reducing fall risk. (49s on 6/12 ; MDC =4.2s)               -  Patient will demonstrate improved functional ability with Patient Specific Scale Score of at least 3.5.      Plan:  Will re-assess ROM upon her return as she will be OOT next week.     {PRN Timed/Untimed billable minutes:43252}  {INTERVENTION MINUTES:42227}      Yaslin Kirtley, PT  03/26/2024, 07:58

## 2024-03-28 ENCOUNTER — Ambulatory Visit
Admission: RE | Admit: 2024-03-28 | Discharge: 2024-03-28 | Disposition: A | Discharge status: Still In Hospital | Payer: Self-pay | Source: Ambulatory Visit | Attending: Orthopaedic Surgery

## 2024-03-28 ENCOUNTER — Other Ambulatory Visit: Payer: Self-pay

## 2024-03-28 NOTE — Progress Notes (Signed)
 [facility]  Outpatient Physical Therapy  [address]  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 Reports she has been able to ambulate without AD for a few weeks without issue. States she wants today to be her last visit as she has met her personal goals. Reports that she needs to leave early today to make it orthopedist today.       Patient-Specific Functional Score:     Problem Score, eval Score, progress check    1. Ambulation without AD  0 10   2. Getting in/out of car 2 10   Total 1.5 10   Total score = sum of the activity scores/number of activities    Minimal detectable change (90% CI) for avg score = 2 points    Minimal detectable change (90% CI) for single activity score = 3 points         Objective:  Treatment as below:    Knee AROM, seated    Right, eval Left, eval Left, progress check    Knee Flexion 95  64  97   Knee Extension 0 degrees  3 degrees from neutral  2 degrees from neutral        Strength (Manual Muscle Testing per [employer] Muscle Grading system)       right Left, eval Left, progress check   Hip flexion  4 3    Hip extension  4 NT    Knee flexion  4 2    Knee extension  4 2    Ankle DF  5 4    Ankle PF  5 4        Five Times Sit to Stand Test: 12.88s from standard chair with UE for push-off    [49s from standard chair with BUE for push-off at eval]    >12 seconds = falls for community dwelling adults    60-69 year olds 11.4 seconds    70-79 year  olds 12.6 seconds    80-89 year olds 14.8 seconds        Timed Up and Go Test (TUG): 10.23s from standard chair without AD     [27s from standard chair with use of FWW at eval]    >13.5 seconds = falls for community dwelling adults    60-69 year olds (7.1-9.0 seconds)    70-79 year olds (8.2-10.2 seconds)    80-99 year olds (10.0-12.7 seconds)      EXERCISE/ACTIVITY NAME REPETITIONS RESISTANCE COMPLETED THIS DOS   Nustep 5 minutes L5 y   Seated heel slides 10   n   Passive ROM EOB     N   Scar mobilization / cross friction scar massage     n   Quad sets 3 sec x 10   n   SAQ 3x10   n   Dynamic Stretch:      -Flexion/extension at tolerable end range  45s   on step Y   Incline board stretch 30s x 2    n   LAQ 3x10   n   SKC with OP x30 No OP N   Long Hallway:  --fwd/back  --tall cone step over    50' x 2   Multiple passes      n  N   Step-ups 10    Y   Lateral step overs 15 ea LE Orange cone N   Prone Quad stretch     Manual prone Quad stretch 3x30   N: HEP 03/15/24  N   Stair stretch for flexion 5x15 sec   N      DISCONTINUED ACTIVITIES            SLR x10   Too easy                          Assessment: Patient seen for 10 of up to 18 planned visits to work on improving left knee strength and range of motion. Good progress made throughout rehab course with 6 of 8 established goals met. Remains with left knee flexion below 100 degrees but ROM is similar to the right lower extremity baseline. Advised patient on continuing to prioritize stretching and mobility at home to prevent any regression of progress. We will discharge at this time as patient reports she will be unable to continue her plan of care due to personal factors.       Short-Term Goals: 3 Weeks   -  Patient will demonstrate improved left knee AROM to at least 120 flexion and neutral extension to aid in ability to ambulate up and down stairs. (NOT MET 03/28/2024)               -  Patient will demonstrate independence with progressive HEP to maximize gains from  physical therapy. (MET 03/28/2024)               -  Patient will demonstrate improved strength of left LE WFL for supine SLR without extension lag to aid in functional transfers. (NOT MET 03/28/2024)               -  Patient will wean to least restrictive assistive device with minimal to no gait deviation to aid community ambulation. (MET 03/28/2024)    Long-Term Goals: 6 Weeks                -  Patient will demonstrate improved left knee strength of at least 4/5 to aid in functional transfers. (MET 03/28/2024)                -Improve score on TUG to at least  21.2s indicating improved dynamic balance and progress toward reduced fall risk. (27s on 6/12 ; MDC =2.9s) (MET 03/28/2024)               -Improve score on 5TSTS to at least 40.6s  indicating improved BLE strength and progress toward reducing fall risk. (49s on 6/12 ; MDC =4.2s) (MET 03/28/2024)               -  Patient will demonstrate improved functional ability with Patient Specific Scale Score of at least 3.5. (MET 03/28/2024)    Plan:  Discharge from PT care. Follow-up with clinic as needed.     Total Session Time 25 and Timed code minutes 25  THERAPEUTIC EXERCISE 25 minutes  Kristof Nadeem Jolanda, PT, DPT, NCS  03/28/2024 10:21
